# Patient Record
Sex: Female | Born: 1960 | Race: White | Hispanic: No | Marital: Married | Smoking: Never smoker
Health system: Southern US, Community
[De-identification: ages and names within clinical notes are randomized; demographics above are authoritative.]

## PROBLEM LIST (undated history)

## (undated) DIAGNOSIS — F32A Depression, unspecified: Secondary | ICD-10-CM

## (undated) DIAGNOSIS — F329 Major depressive disorder, single episode, unspecified: Secondary | ICD-10-CM

## (undated) DIAGNOSIS — F419 Anxiety disorder, unspecified: Secondary | ICD-10-CM

## (undated) HISTORY — DX: Major depressive disorder, single episode, unspecified: F32.9

## (undated) HISTORY — PX: DILATION AND CURETTAGE OF UTERUS: SHX78

## (undated) HISTORY — DX: Depression, unspecified: F32.A

## (undated) HISTORY — DX: Anxiety disorder, unspecified: F41.9

---

## 2006-04-03 ENCOUNTER — Ambulatory Visit: Payer: Self-pay | Admitting: Family Medicine

## 2011-05-23 ENCOUNTER — Ambulatory Visit: Payer: Self-pay | Admitting: Unknown Physician Specialty

## 2011-06-07 LAB — BASIC METABOLIC PANEL
BUN: 17 mg/dL (ref 4–21)
Creatinine: 0.9 mg/dL (ref 0.5–1.1)
Glucose: 80 mg/dL
Potassium: 4.2 mmol/L (ref 3.4–5.3)
Sodium: 143 mmol/L (ref 137–147)

## 2011-06-07 LAB — HEPATIC FUNCTION PANEL
ALT: 22 U/L (ref 7–35)
AST: 26 U/L (ref 13–35)
Alkaline Phosphatase: 58 U/L (ref 25–125)
BILIRUBIN, TOTAL: 0.4 mg/dL

## 2011-06-07 LAB — CBC AND DIFFERENTIAL
HCT: 42 % (ref 36–46)
Hemoglobin: 14.2 g/dL (ref 12.0–16.0)
Neutrophils Absolute: 6 /uL
Platelets: 238 10*3/uL (ref 150–399)
WBC: 8.3 10^3/mL

## 2011-06-07 LAB — TSH: TSH: 1.29 u[IU]/mL (ref 0.41–5.90)

## 2012-06-24 ENCOUNTER — Ambulatory Visit: Payer: Self-pay

## 2012-06-24 LAB — PREGNANCY, URINE: Pregnancy Test, Urine: NEGATIVE m[IU]/mL

## 2012-06-24 LAB — HEMATOCRIT: HCT: 42.9 % (ref 35.0–47.0)

## 2012-06-27 ENCOUNTER — Ambulatory Visit: Payer: Self-pay

## 2012-06-27 LAB — PREGNANCY, URINE: Pregnancy Test, Urine: NEGATIVE m[IU]/mL

## 2012-06-28 LAB — PATHOLOGY REPORT

## 2014-04-27 ENCOUNTER — Ambulatory Visit: Payer: Self-pay | Admitting: Unknown Physician Specialty

## 2014-04-27 LAB — HM COLONOSCOPY

## 2015-02-02 NOTE — Op Note (Signed)
PATIENT NAME:  Elizabeth Walter, MORT MR#:  [id number] DATE OF BIRTH:  [birth year]  DATE OF PROCEDURE:  06/27/2012  PREOPERATIVE DIAGNOSIS: Abnormal uterine bleeding.  POSTOPERATIVE DIAGNOSIS: Abnormal uterine bleeding  PROCEDURE PERFORMED: Dilation and curettage (failed hysteroscopy).   SURGEON: Wonda Cheng. Laurey Morale, M.D.   OPERATIVE FINDINGS: The cervix descends to introitus with traction. Cervix was tight and was unable to dilate adequately for hysteroscopy.   DESCRIPTION OF PROCEDURE: After adequate general anesthesia, the patient was prepped and draped in routine fashion. Examination under anesthesia revealed no abnormal masses. The cervix was grasped with a Jacob's tenaculum and sounded to 8 cm. The cervix was unable to be dilated far enough to allow introduction of the hysteroscope. The uterine cavity was systematically curetted with return of minimal amount of tissue. The patient tolerated the procedure well and left the Operating Room in good condition. Sponge and needle counts were said to be correct at the end of the procedure.  ____________________________ Wonda Cheng. Laurey Morale, MD pjr:slb D: 06/27/2012 13:08:39 ET T: 06/27/2012 13:55:32 ET JOB#: [id number]  cc: Wonda Cheng. Laurey Morale, MD, <Dictator> Rosina Lowenstein MD ELECTRONICALLY SIGNED 06/28/2012 15:51

## 2016-02-16 ENCOUNTER — Other Ambulatory Visit: Payer: Self-pay | Admitting: Family Medicine

## 2016-02-16 NOTE — Telephone Encounter (Signed)
Pt contacted office for refill request on the following medications: Sertraline HCI 50 MCG to [facility] on [address]. Pt scheduled a follow up visit for 05/01/16. Pt would like a new RX with one refill to last her until her appt in July.  Last written on: 03/04/15 Last OV: 03/04/15 Please advise. Thanks TNP

## 2016-02-16 NOTE — Telephone Encounter (Signed)
Please review-aa 

## 2016-02-17 DIAGNOSIS — N950 Postmenopausal bleeding: Secondary | ICD-10-CM | POA: Insufficient documentation

## 2016-02-17 DIAGNOSIS — F325 Major depressive disorder, single episode, in full remission: Secondary | ICD-10-CM | POA: Insufficient documentation

## 2016-02-17 DIAGNOSIS — F419 Anxiety disorder, unspecified: Secondary | ICD-10-CM | POA: Insufficient documentation

## 2016-02-17 DIAGNOSIS — F40228 Other natural environment type phobia: Secondary | ICD-10-CM | POA: Insufficient documentation

## 2016-02-17 DIAGNOSIS — IMO0002 Reserved for concepts with insufficient information to code with codable children: Secondary | ICD-10-CM | POA: Insufficient documentation

## 2016-02-17 DIAGNOSIS — M797 Fibromyalgia: Secondary | ICD-10-CM | POA: Insufficient documentation

## 2016-02-17 DIAGNOSIS — M9979 Connective tissue and disc stenosis of intervertebral foramina of abdomen and other regions: Secondary | ICD-10-CM | POA: Insufficient documentation

## 2016-02-17 MED ORDER — SERTRALINE HCL 50 MG PO TABS: 50.0000 mg | ORAL_TABLET | Freq: Every day | ORAL | Status: DC

## 2016-02-17 NOTE — Telephone Encounter (Signed)
rx sent in-aa 

## 2016-02-17 NOTE — Telephone Encounter (Signed)
3 month refills then see me thuis summer.

## 2016-03-01 ENCOUNTER — Other Ambulatory Visit: Payer: Self-pay | Admitting: Family Medicine

## 2016-04-25 ENCOUNTER — Other Ambulatory Visit: Payer: Self-pay | Admitting: Family Medicine

## 2016-04-25 NOTE — Telephone Encounter (Signed)
Dr g She is requesting refill on Xanax but has not been seen in over 1 year. Thanks [employer]

## 2016-05-01 ENCOUNTER — Ambulatory Visit (INDEPENDENT_AMBULATORY_CARE_PROVIDER_SITE_OTHER): Payer: BLUE CROSS/BLUE SHIELD | Admitting: Family Medicine

## 2016-05-01 ENCOUNTER — Encounter: Payer: Self-pay | Admitting: Family Medicine

## 2016-05-01 VITALS — BP 104/60 | HR 78 | Temp 98.0°F | Resp 16 | Ht 67.0 in | Wt 148.0 lb

## 2016-05-01 DIAGNOSIS — F419 Anxiety disorder, unspecified: Secondary | ICD-10-CM | POA: Diagnosis not present

## 2016-05-01 DIAGNOSIS — R5383 Other fatigue: Secondary | ICD-10-CM | POA: Diagnosis not present

## 2016-05-01 DIAGNOSIS — F329 Major depressive disorder, single episode, unspecified: Secondary | ICD-10-CM | POA: Diagnosis not present

## 2016-05-01 DIAGNOSIS — F32A Depression, unspecified: Secondary | ICD-10-CM

## 2016-05-01 DIAGNOSIS — N951 Menopausal and female climacteric states: Secondary | ICD-10-CM | POA: Diagnosis not present

## 2016-05-01 MED ORDER — ALPRAZOLAM 0.5 MG PO TABS: 0.5000 mg | ORAL_TABLET | Freq: Every day | ORAL | Status: DC | PRN

## 2016-05-01 MED ORDER — SERTRALINE HCL 100 MG PO TABS: 100.0000 mg | ORAL_TABLET | Freq: Every day | ORAL | Status: DC

## 2016-05-01 NOTE — Progress Notes (Addendum)
Patient ID: Elizabeth Walter, female   DOB: [birth year], 55 y.o.   MRN: [id number]    Subjective:  HPI Depression/Anxiety- Pt is here for a follow up of depression and anxiety. She needs a refill on her Sertraline and she had not been seen since 03/04/15. She reports that she is doing well on the medication and seems to be controlling her symptoms well. She would like a refill on her xanax as well. She only uses this when she is flying.   Fatigue- This has been occuring for about a year. She just feels like she is tired all the time. Pt reports that she is sleeping fairly well at night except when she has hot flashes. " I am just sleepy all the time." She also says that she gets lightheaded/dizzy when she stands up or bends over. Typically she is outside in the heat when this happens. Her BP today was 104/60.  Prior to Admission medications   Medication Sig Start Date End Date Taking? Authorizing Provider  ALPRAZolam Duanne Moron) 0.5 MG tablet TAKE 1 TABLET DAILY AS NEEDED 04/27/16  Yes Jerrol Banana., MD  sertraline (ZOLOFT) 50 MG tablet TAKE 1 TABLET(S) BY MOUTH DAILY 03/01/16  Yes Jerrol Banana., MD    Patient Active Problem List   Diagnosis Date Noted  . Anxiety 02/17/2016  . 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 Fibrositis 02/17/2016  . 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 Herniation of nucleus pulposus 02/17/2016  . Postmenopausal bleeding 02/17/2016    History reviewed. No pertinent past medical history.  Social History   Social History  . Marital Status: Married    Spouse Name: N/A  . Number of Children: N/A  . Years of Education: N/A   Occupational History  . Not on file.   Social History Main Topics  . Smoking status: Never Smoker   . Smokeless tobacco: Not on file  . Alcohol Use: Yes     Comment: glass of wine 6 days a week  . Drug Use: No  . Sexual Activity: Not on file   Other Topics Concern  . Not on file   Social  History Narrative    Allergies  Allergen Reactions  . Penicillins Rash    Review of Systems  Constitutional: Positive for malaise/fatigue.  HENT: Negative.   Eyes: Negative.   Respiratory: Negative.   Cardiovascular: Negative.   Gastrointestinal: Negative.   Genitourinary: Negative.   Musculoskeletal: Negative.   Skin: Negative.   Neurological: Positive for dizziness.  Endo/Heme/Allergies: Negative.   Psychiatric/Behavioral: Negative.     Immunization History  Administered Date(s) Administered  . Tdap 07/21/2009   Objective:  BP 104/60 mmHg  Pulse 78  Temp(Src) 98 F (36.7 C) (Oral)  Resp 16  Ht 5\' 7"  (1.702 m)  Wt 148 lb (67.132 kg)  BMI 23.17 kg/m2  Physical Exam  Constitutional: She is oriented to person, place, and time and well-developed, well-nourished, and in no distress.  HENT:  Head: Normocephalic and atraumatic.  Right Ear: External ear normal.  Left Ear: External ear normal.  Nose: Nose normal.  Eyes: Conjunctivae and EOM are normal. Pupils are equal, round, and reactive to light.  Neck: Normal range of motion. Neck supple.  Cardiovascular: Normal rate, regular rhythm, normal heart sounds and intact distal pulses.   Pulmonary/Chest: Effort normal and breath sounds normal.  Abdominal: Soft. Bowel sounds are normal.  Musculoskeletal: Normal range of motion.  Neurological: She is alert and  oriented to person, place, and time. She has normal reflexes. Gait normal. GCS score is 15.  Skin: Skin is warm and dry.  Psychiatric: Mood, memory, affect and judgment normal.    Lab Results  Component Value Date   WBC 8.3 06/07/2011   HGB 14.2 06/07/2011   HCT 42.9 06/24/2012   PLT 238 06/07/2011   TSH 1.29 06/07/2011    CMP     Component Value Date/Time   NA 143 06/07/2011   K 4.2 06/07/2011   BUN 17 06/07/2011   CREATININE 0.9 06/07/2011   AST 26 06/07/2011   ALT 22 06/07/2011   ALKPHOS 58 06/07/2011    Assessment and Plan :  1.  Anxiety Increase Sertraline due to irritability possibly related to menopausal. If does help pt will consider HRT options.   - ALPRAZolam (XANAX) 0.5 MG tablet; Take 1 tablet (0.5 mg total) by mouth daily as needed.  Dispense: 10 tablet; Refill: 0 - sertraline (ZOLOFT) 100 MG tablet; Take 1 tablet (100 mg total) by mouth daily.  Dispense: 30 tablet; Refill: 12  2. Clinical depression  - sertraline (ZOLOFT) 100 MG tablet; Take 1 tablet (100 mg total) by mouth daily.  Dispense: 30 tablet; Refill: 12  3. Menopausal syndrome (hot flashes) Consider HRT if the increase of sertraline does not help symptoms. Patient has had hot flashes for several years. She is postmenopausal. This can possible benefits of HRT discussed at some length.  He has had significant hot flashes for the past 4 years. I think the menopause is also contributing to her depression and anxiety and irritability. 4. Other fatigue Overall symptoms discussed and their relationship to each other. - CBC with Differential/Platelet - TSH - Comprehensive metabolic panel  Patient was seen and examined by Dr. Miguel Aschoff, and noted scribed by Webb Laws, [place] MD [facility] 05/01/2016 10:51 AM

## 2016-05-02 ENCOUNTER — Telehealth: Payer: Self-pay

## 2016-05-02 LAB — COMPREHENSIVE METABOLIC PANEL
ALBUMIN: 4.3 g/dL (ref 3.5–5.5)
ALT: 19 IU/L (ref 0–32)
AST: 23 IU/L (ref 0–40)
Albumin/Globulin Ratio: 1.7 (ref 1.2–2.2)
Alkaline Phosphatase: 93 IU/L (ref 39–117)
BUN / CREAT RATIO: 20 (ref 9–23)
BUN: 18 mg/dL (ref 6–24)
Bilirubin Total: 0.5 mg/dL (ref 0.0–1.2)
CALCIUM: 9.6 mg/dL (ref 8.7–10.2)
CO2: 25 mmol/L (ref 18–29)
CREATININE: 0.91 mg/dL (ref 0.57–1.00)
Chloride: 102 mmol/L (ref 96–106)
GFR calc Af Amer: 83 mL/min/{1.73_m2} (ref >59)
GFR, EST NON AFRICAN AMERICAN: 72 mL/min/{1.73_m2} (ref >59)
GLOBULIN, TOTAL: 2.6 g/dL (ref 1.5–4.5)
GLUCOSE: 98 mg/dL (ref 65–99)
Potassium: 4.4 mmol/L (ref 3.5–5.2)
SODIUM: 143 mmol/L (ref 134–144)
Total Protein: 6.9 g/dL (ref 6.0–8.5)

## 2016-05-02 LAB — CBC WITH DIFFERENTIAL/PLATELET
BASOS: 0 %
Basophils Absolute: 0 10*3/uL (ref 0.0–0.2)
EOS (ABSOLUTE): 0 10*3/uL (ref 0.0–0.4)
EOS: 1 %
HEMATOCRIT: 42 % (ref 34.0–46.6)
HEMOGLOBIN: 14 g/dL (ref 11.1–15.9)
IMMATURE GRANS (ABS): 0 10*3/uL (ref 0.0–0.1)
IMMATURE GRANULOCYTES: 0 %
LYMPHS: 33 %
Lymphocytes Absolute: 1.6 10*3/uL (ref 0.7–3.1)
MCH: 30.2 pg (ref 26.6–33.0)
MCHC: 33.3 g/dL (ref 31.5–35.7)
MCV: 91 fL (ref 79–97)
MONOCYTES: 8 %
Monocytes Absolute: 0.4 10*3/uL (ref 0.1–0.9)
NEUTROS PCT: 58 %
Neutrophils Absolute: 2.7 10*3/uL (ref 1.4–7.0)
Platelets: 242 10*3/uL (ref 150–379)
RBC: 4.63 x10E6/uL (ref 3.77–5.28)
RDW: 13.6 % (ref 12.3–15.4)
WBC: 4.7 10*3/uL (ref 3.4–10.8)

## 2016-05-02 LAB — TSH: TSH: 1.2 u[IU]/mL (ref 0.450–4.500)

## 2016-05-02 NOTE — Telephone Encounter (Signed)
-----   Message from Jerrol Banana., MD sent at 05/02/2016  8:15 AM EDT ----- Labs okay.

## 2016-05-02 NOTE — Telephone Encounter (Signed)
Pt advised.   Thanks,   -Tula Schryver  

## 2016-06-29 ENCOUNTER — Ambulatory Visit: Payer: BLUE CROSS/BLUE SHIELD | Admitting: Family Medicine

## 2016-07-06 ENCOUNTER — Ambulatory Visit: Payer: BLUE CROSS/BLUE SHIELD | Admitting: Family Medicine

## 2017-06-29 ENCOUNTER — Other Ambulatory Visit: Payer: Self-pay | Admitting: Family Medicine

## 2017-06-29 DIAGNOSIS — F32A Depression, unspecified: Secondary | ICD-10-CM

## 2017-06-29 DIAGNOSIS — F419 Anxiety disorder, unspecified: Secondary | ICD-10-CM

## 2017-06-29 DIAGNOSIS — F329 Major depressive disorder, single episode, unspecified: Secondary | ICD-10-CM

## 2017-07-12 ENCOUNTER — Telehealth: Payer: Self-pay | Admitting: Family Medicine

## 2017-07-12 NOTE — Telephone Encounter (Signed)
Patient call and left message that she needs a referral to [facility] to get her mammogram and would also like to know when her last DPT shot was. CB# [phone]  Thanks CC

## 2017-07-13 NOTE — Telephone Encounter (Signed)
Patient advised that we have not seen her since 04/2016 and last mammogram we have on file was 2012. Wanted to check with you if that is ok to place an order for her routine mammogram or does patient need to be seen first?  Also advised patient she had Tdap in 2010 and she just needs Td booster in 2020. Her daughter in law told patient Tdap had to be within last 5 years. I advised patient that we have not heard of this regulation, our current regulation for protection against whopping cough for the babies is as long as you had Tdap one time in your life, unless you are pregnant, you do not need to repeat it. Patient Elizabeth Walter, RMA

## 2017-07-17 NOTE — Telephone Encounter (Signed)
If Tdap is well documented with [place] she is fine. I checked with the knobs an OB  to make sure.

## 2017-08-29 LAB — HM MAMMOGRAPHY

## 2017-11-19 ENCOUNTER — Telehealth: Payer: Self-pay

## 2017-11-19 NOTE — Telephone Encounter (Signed)
Please review. Ok to send something into the pharmacy?

## 2017-11-19 NOTE — Telephone Encounter (Signed)
Patient is requesting a RX be sent to [place]. Symptoms include stuffy nose, congestion, and headache X 3 weeks. Patient advised she may need a OV for evaluation, but she requested message be sent anyway. CB#[phone]

## 2017-11-19 NOTE — Telephone Encounter (Signed)
Scheduled appt for tomorrow °

## 2017-11-19 NOTE — Telephone Encounter (Signed)
I could see her tomorrow or Wed.

## 2017-11-20 ENCOUNTER — Encounter: Payer: Self-pay | Admitting: Family Medicine

## 2017-11-20 ENCOUNTER — Ambulatory Visit (INDEPENDENT_AMBULATORY_CARE_PROVIDER_SITE_OTHER): Payer: BLUE CROSS/BLUE SHIELD | Admitting: Family Medicine

## 2017-11-20 VITALS — BP 120/76 | HR 84 | Temp 98.1°F | Resp 16 | Wt 153.0 lb

## 2017-11-20 DIAGNOSIS — J40 Bronchitis, not specified as acute or chronic: Secondary | ICD-10-CM

## 2017-11-20 DIAGNOSIS — J0140 Acute pansinusitis, unspecified: Secondary | ICD-10-CM | POA: Diagnosis not present

## 2017-11-20 MED ORDER — DOXYCYCLINE HYCLATE 100 MG PO TABS: 100.0000 mg | ORAL_TABLET | Freq: Two times a day (BID) | ORAL | 0 refills | Status: DC

## 2017-11-20 NOTE — Progress Notes (Signed)
Patient: Elizabeth Walter Female    DOB: 14-Apr-1961   57 y.o.   MRN: [id number] Visit Date: 11/20/2017  Today's Provider: Wilhemena Durie, MD   Chief Complaint  Patient presents with  . URI   Subjective:    URI   This is a new problem. Episode onset: current sx x 3 weeks; was diagnosed with bronchitis in December, and was treated with Z Pak. Pt states sx improved after abx use, but never resolved. Started feeling worse 3 weeks ago. The problem has been unchanged. Maximum temperature: no documented temp, but pt felt feverish. Associated symptoms include congestion, coughing (sometimes dry, sometimes productive), headaches, rhinorrhea, sneezing, a sore throat and swollen glands. Pertinent negatives include no abdominal pain, chest pain, ear pain, nausea, neck pain, plugged ear sensation, sinus pain, vomiting or wheezing. Treatments tried: OTC cold medications. The treatment provided no relief.       Allergies  Allergen Reactions  . Penicillins Rash     Current Outpatient Medications:  .  ALPRAZolam (XANAX) 0.5 MG tablet, Take 1 tablet (0.5 mg total) by mouth daily as needed., Disp: 10 tablet, Rfl: 0 .  sertraline (ZOLOFT) 100 MG tablet, TAKE 1 TABLET (100 MG TOTAL) BY MOUTH DAILY., Disp: 90 tablet, Rfl: 1  Review of Systems  HENT: Positive for congestion, rhinorrhea, sneezing and sore throat. Negative for ear pain and sinus pain.   Respiratory: Positive for cough (sometimes dry, sometimes productive). Negative for wheezing.   Cardiovascular: Negative for chest pain.  Gastrointestinal: Negative for abdominal pain, nausea and vomiting.  Musculoskeletal: Negative for neck pain.  Neurological: Positive for headaches.    Social History   Tobacco Use  . Smoking status: Never Smoker  . Smokeless tobacco: Never Used  Substance Use Topics  . Alcohol use: Yes    Comment: glass of wine 6 days a week   Objective:   BP 120/76 (BP Location: Left Arm, Patient Position: Sitting,  Cuff Size: Normal)   Pulse 84   Temp 98.1 F (36.7 C) (Oral)   Resp 16   Wt 153 lb (69.4 kg)   SpO2 98%   BMI 23.96 kg/m  Vitals:   11/20/17 0808  BP: 120/76  Pulse: 84  Resp: 16  Temp: 98.1 F (36.7 C)  TempSrc: Oral  SpO2: 98%  Weight: 153 lb (69.4 kg)     Physical Exam  Constitutional: She appears well-developed and well-nourished.  HENT:  Head: Normocephalic.  Right Ear: Tympanic membrane normal.  Left Ear: Tympanic membrane normal.  Nose: Nose normal. Right sinus exhibits no maxillary sinus tenderness and no frontal sinus tenderness. Left sinus exhibits no maxillary sinus tenderness and no frontal sinus tenderness.  Mouth/Throat: No oropharyngeal exudate.  Eyes: Conjunctivae are normal.  Neck: Normal range of motion. Neck supple.  Cardiovascular: Normal rate, regular rhythm and normal heart sounds.  Pulmonary/Chest: Effort normal and breath sounds normal. No respiratory distress.  Lymphadenopathy:    She has no cervical adenopathy.  Psychiatric: She has a normal mood and affect. Her behavior is normal.        Assessment & Plan:     1. Bronchitis Will treat with Doxy as below. Return if sx fail to improve or worsen. - doxycycline (VIBRA-TABS) 100 MG tablet; Take 1 tablet (100 mg total) by mouth 2 (two) times daily.  Dispense: 20 tablet; Refill: 0  2. Acute non-recurrent pansinusitis Advised pt to use Neill Sinus Rinse for sinusitis. Treat with abx as below. -  doxycycline (VIBRA-TABS) 100 MG tablet; Take 1 tablet (100 mg total) by mouth 2 (two) times daily.  Dispense: 20 tablet; Refill: 0    Patient seen and examined by Miguel Aschoff, MD, and note scribed by Martha Clan, CMA.  Richard Cranford Mon, MD  [facility]

## 2017-11-20 NOTE — Patient Instructions (Signed)
Dr. Rosanna Randy recommends using Maryruth Hancock Sinus Rinse.  Sinus Rinse What is a sinus rinse? A sinus rinse is a simple home treatment that is used to rinse your sinuses with a sterile mixture of salt and water (saline solution). Sinuses are air-filled spaces in your skull behind the bones of your face and forehead that open into your nasal cavity. You will use the following:  Saline solution.  Neti pot or spray bottle. This releases the saline solution into your nose and through your sinuses. Neti pots and spray bottles can be purchased at [employer], a health food store, or online.  When would I do a sinus rinse? A sinus rinse can help to clear mucus, dirt, dust, or pollen from the nasal cavity. You may do a sinus rinse when you have a cold, a virus, nasal allergy symptoms, a sinus infection, or stuffiness in the nose or sinuses. If you are considering a sinus rinse:  Ask your child's health care provider before performing a sinus rinse on your child.  Do not do a sinus rinse if you have had ear or nasal surgery, ear infection, or blocked ears.  How do I do a sinus rinse?  Wash your hands.  Disinfect your device according to the directions provided and then dry it.  Use the solution that comes with your device or one that is sold separately in stores. Follow the mixing directions on the package.  Fill your device with the amount of saline solution as directed by the device instructions.  Stand over a sink and tilt your head sideways over the sink.  Place the spout of the device in your upper nostril (the one closer to the ceiling).  Gently pour or squeeze the saline solution into the nasal cavity. The liquid should drain to the lower nostril if you are not overly congested.  Gently blow your nose. Blowing too hard may cause ear pain.  Repeat in the other nostril.  Clean and rinse your device with clean water and then air-dry it. Are there risks of a sinus rinse? Sinus rinse  is generally very safe and effective. However, there are a few risks, which include:  A burning sensation in the sinuses. This may happen if you do not make the saline solution as directed. Make sure to follow all directions when making the saline solution.  Infection from contaminated water. This is rare, but possible.  Nasal irritation.  This information is not intended to replace advice given to you by your health care provider. Make sure you discuss any questions you have with your health care provider. Document Released: 04/29/2014 Document Revised: 08/29/2016 Document Reviewed: 02/17/2014 Elsevier Interactive Patient Education  2017 [employer].

## 2017-11-23 ENCOUNTER — Telehealth: Payer: Self-pay | Admitting: Family Medicine

## 2017-11-23 NOTE — Telephone Encounter (Signed)
Please advise 

## 2017-11-23 NOTE — Telephone Encounter (Signed)
Pt stated she saw Dr. Rosanna Randy on 11/20/17 and that he told her if she wasn't feeling better to call back and he would send in an Rx for Prednisone. Pt is requesting Prednisone sent to [facility]. Pt was advised Dr. Rosanna Randy is out of the office today and requested the message be sent to another provider. Please advise. Thanks TNP

## 2017-11-24 MED ORDER — PREDNISONE 10 MG PO TABS: ORAL_TABLET | ORAL | 0 refills | Status: AC

## 2017-11-24 NOTE — Telephone Encounter (Signed)
Have sent prescription for prednisone to [facility]

## 2017-11-26 NOTE — Telephone Encounter (Signed)
Patient advised.

## 2018-01-25 ENCOUNTER — Ambulatory Visit: Payer: BLUE CROSS/BLUE SHIELD | Admitting: Physician Assistant

## 2018-01-25 ENCOUNTER — Other Ambulatory Visit: Payer: Self-pay | Admitting: Family Medicine

## 2018-01-25 DIAGNOSIS — F419 Anxiety disorder, unspecified: Secondary | ICD-10-CM

## 2018-01-25 DIAGNOSIS — F32A Depression, unspecified: Secondary | ICD-10-CM

## 2018-01-25 DIAGNOSIS — F329 Major depressive disorder, single episode, unspecified: Secondary | ICD-10-CM

## 2018-03-05 ENCOUNTER — Ambulatory Visit: Admit: 2018-03-05 | Payer: Self-pay | Admitting: Ophthalmology

## 2018-03-05 SURGERY — BLEPHAROPLASTY
Anesthesia: Monitor Anesthesia Care | Laterality: Bilateral

## 2018-06-24 ENCOUNTER — Encounter: Payer: Self-pay | Admitting: Family Medicine

## 2018-06-24 ENCOUNTER — Ambulatory Visit (INDEPENDENT_AMBULATORY_CARE_PROVIDER_SITE_OTHER): Payer: BLUE CROSS/BLUE SHIELD | Admitting: Family Medicine

## 2018-06-24 VITALS — BP 108/62 | HR 78 | Temp 98.3°F | Resp 16 | Ht 67.0 in | Wt 144.0 lb

## 2018-06-24 DIAGNOSIS — F329 Major depressive disorder, single episode, unspecified: Secondary | ICD-10-CM | POA: Diagnosis not present

## 2018-06-24 DIAGNOSIS — Z124 Encounter for screening for malignant neoplasm of cervix: Secondary | ICD-10-CM

## 2018-06-24 DIAGNOSIS — R232 Flushing: Secondary | ICD-10-CM | POA: Diagnosis not present

## 2018-06-24 DIAGNOSIS — F32A Depression, unspecified: Secondary | ICD-10-CM

## 2018-06-24 DIAGNOSIS — Z23 Encounter for immunization: Secondary | ICD-10-CM | POA: Diagnosis not present

## 2018-06-24 DIAGNOSIS — F419 Anxiety disorder, unspecified: Secondary | ICD-10-CM

## 2018-06-24 DIAGNOSIS — F325 Major depressive disorder, single episode, in full remission: Secondary | ICD-10-CM

## 2018-06-24 MED ORDER — ALPRAZOLAM 0.5 MG PO TABS: 0.5000 mg | ORAL_TABLET | Freq: Every day | ORAL | 0 refills | Status: AC | PRN

## 2018-06-24 MED ORDER — SERTRALINE HCL 100 MG PO TABS: 100.0000 mg | ORAL_TABLET | Freq: Every day | ORAL | 3 refills | Status: DC

## 2018-06-24 NOTE — Patient Instructions (Signed)
Start Black Cohash once daily for hot flashes.

## 2018-06-24 NOTE — Progress Notes (Signed)
Patient: Elizabeth Walter Female    DOB: 05-Mar-1961   57 y.o.   MRN: [id number] Visit Date: 06/24/2018  Today's Provider: Wilhemena Durie, MD   Chief Complaint  Patient presents with  . Anxiety   Subjective:    HPI Patient comes in today for a follow up on chronic issues. She was last seen in the office for this in 04/2016.  Depression/Anxiety- Patient is currently on setraline 100mg  daily. She reports good compliance, and good symptom control. Depression screen PHQ 2/9 06/24/2018  Decreased Interest 1  Down, Depressed, Hopeless 0  PHQ - 2 Score 1  Altered sleeping 1  Tired, decreased energy 1  Change in appetite 0  Feeling bad or failure about yourself  0  Trouble concentrating 0  Moving slowly or fidgety/restless 0  Suicidal thoughts 0  PHQ-9 Score 3  Difficult doing work/chores Not difficult at all     Hot Flashes- Patient reports that she has been dealing with this for at least 8 years. However, her symptoms are becoming more frequent and lasting all day. Patient wants to discuss possibly starting medication for this.      Allergies  Allergen Reactions  . Penicillins Rash     Current Outpatient Medications:  .  ALPRAZolam (XANAX) 0.5 MG tablet, Take 1 tablet (0.5 mg total) by mouth daily as needed., Disp: 10 tablet, Rfl: 0 .  sertraline (ZOLOFT) 100 MG tablet, TAKE 1 TABLET (100 MG TOTAL) BY MOUTH DAILY, Disp: 90 tablet, Rfl: 0 .  doxycycline (VIBRA-TABS) 100 MG tablet, Take 1 tablet (100 mg total) by mouth 2 (two) times daily. (Patient not taking: Reported on 06/24/2018), Disp: 20 tablet, Rfl: 0  Review of Systems  Constitutional: Negative for activity change, appetite change, chills, diaphoresis, fatigue, fever and unexpected weight change.  Eyes: Negative.   Respiratory: Negative for cough and shortness of breath.   Cardiovascular: Negative for chest pain, palpitations and leg swelling.  Gastrointestinal: Negative.   Endocrine: Negative for cold  intolerance, heat intolerance, polydipsia, polyphagia and polyuria.  Allergic/Immunologic: Negative for environmental allergies.  Neurological: Negative for dizziness and light-headedness.  Hematological: Negative.   Psychiatric/Behavioral: Negative.     Social History   Tobacco Use  . Smoking status: Never Smoker  . Smokeless tobacco: Never Used  Substance Use Topics  . Alcohol use: Yes    Comment: glass of wine 6 days a week   Objective:   BP 108/62 (BP Location: Left Arm, Patient Position: Sitting, Cuff Size: Normal)   Pulse 78   Temp 98.3 F (36.8 C)   Resp 16   Ht 5\' 7"  (1.702 m)   Wt 144 lb (65.3 kg)   SpO2 99%   BMI 22.55 kg/m  Vitals:   06/24/18 1506  BP: 108/62  Pulse: 78  Resp: 16  Temp: 98.3 F (36.8 C)  SpO2: 99%  Weight: 144 lb (65.3 kg)  Height: 5\' 7"  (1.702 m)     Physical Exam  Constitutional: She is oriented to person, place, and time. She appears well-developed and well-nourished.  HENT:  Head: Normocephalic and atraumatic.  Eyes: Conjunctivae are normal. No scleral icterus.  Neck: No thyromegaly present.  Cardiovascular: Normal rate, regular rhythm and normal heart sounds.  Pulmonary/Chest: Effort normal.  Abdominal: Soft.  Musculoskeletal: She exhibits no edema.  Neurological: She is alert and oriented to person, place, and time.  Skin: Skin is warm and dry.  Psychiatric: She has a normal mood and affect. Her  behavior is normal. Judgment and thought content normal.        Assessment & Plan:     1. Anxiety  - sertraline (ZOLOFT) 100 MG tablet; Take 1 tablet (100 mg total) by mouth daily.  Dispense: 90 tablet; Refill: 3 - ALPRAZolam (XANAX) 0.5 MG tablet; Take 1 tablet (0.5 mg total) by mouth daily as needed.  Dispense: 10 tablet; Refill: 0  2. Clinical depression In remission. Discussed probable lifelong SSRI. - sertraline (ZOLOFT) 100 MG tablet; Take 1 tablet (100 mg total) by mouth daily.  Dispense: 90 tablet; Refill: 3  3. Hot  flashes Start with Bloack cohash OTC. Pt considering CBD. We discussed this at some length. - Ambulatory referral to Gynecology  4. Cervical cancer screening  - Ambulatory referral to Gynecology  5. Flu vaccine need Also recommend medcial exam 2020. - Flu Vaccine QUAD 6+ mos PF IM (Fluarix Quad PF)        Wilhemena Durie, MD  [facility]

## 2018-07-18 ENCOUNTER — Encounter: Payer: Self-pay | Admitting: Obstetrics and Gynecology

## 2018-07-18 ENCOUNTER — Other Ambulatory Visit (HOSPITAL_COMMUNITY)
Admission: RE | Admit: 2018-07-18 | Discharge: 2018-07-18 | Disposition: A | Discharge status: Home / Self Care | Payer: BLUE CROSS/BLUE SHIELD | Source: Ambulatory Visit | Attending: Obstetrics and Gynecology | Admitting: Obstetrics and Gynecology

## 2018-07-18 ENCOUNTER — Ambulatory Visit (INDEPENDENT_AMBULATORY_CARE_PROVIDER_SITE_OTHER): Payer: BLUE CROSS/BLUE SHIELD | Admitting: Obstetrics and Gynecology

## 2018-07-18 VITALS — BP 112/66 | HR 91 | Ht 67.0 in | Wt 146.0 lb

## 2018-07-18 DIAGNOSIS — Z01411 Encounter for gynecological examination (general) (routine) with abnormal findings: Secondary | ICD-10-CM

## 2018-07-18 DIAGNOSIS — Z1151 Encounter for screening for human papillomavirus (HPV): Secondary | ICD-10-CM

## 2018-07-18 DIAGNOSIS — Z1239 Encounter for other screening for malignant neoplasm of breast: Secondary | ICD-10-CM

## 2018-07-18 DIAGNOSIS — N951 Menopausal and female climacteric states: Secondary | ICD-10-CM

## 2018-07-18 DIAGNOSIS — Z01419 Encounter for gynecological examination (general) (routine) without abnormal findings: Secondary | ICD-10-CM

## 2018-07-18 DIAGNOSIS — Z124 Encounter for screening for malignant neoplasm of cervix: Secondary | ICD-10-CM | POA: Diagnosis present

## 2018-07-18 NOTE — Patient Instructions (Signed)
I value your feedback and entrusting us with your care. If you get a  patient survey, I would appreciate you taking the time to let us know about your experience today. Thank you!  [facility]: [phone]    

## 2018-07-18 NOTE — Progress Notes (Signed)
PCP: Jerrol Banana., MD   Chief Complaint  Patient presents with  . Gynecologic Exam    menopausal S&S    HPI:      [name] is a 57 y.o. No obstetric history on file. who LMP was No LMP recorded. Patient is postmenopausal., presents today for her NP>3 yrs annual examination.  Her menses are absent due to menopause. She does not have intermenstrual bleeding. She does have vasomotor sx. Has tried black cohosh recently but didn't give it much time to work. Sx are not as intense as late 68s but daily and frequent. Interested in some relief.  Sex activity: single partner, contraception - post menopausal status. She does have vaginal dryness. Is using lubricants with relief.   Last Pap: June 07, 2012  Results were: no abnormalities /neg HPV DNA.  Hx of STDs: none  Last mammogram: August 29, 2017  Results were: normal--routine follow-up in 12 months There is no FH of breast cancer. There is no FH of ovarian cancer. The patient does do self-breast exams.  Colonoscopy: colonoscopy 5 years ago without abnormalities.  Repeat due after 10 years.   Tobacco use: The patient denies current or previous tobacco use. Alcohol use: glass of wine with dinner Exercise: moderately active  She does get adequate calcium and Vitamin D in her diet.  Labs with PCP.   Past Medical History:  Diagnosis Date  . Anxiety and depression     Past Surgical History:  Procedure Laterality Date  . DILATION AND CURETTAGE OF UTERUS      Family History  Problem Relation Age of Onset  . COPD Mother   . Depression Son   . Arthritis Sister   . Arthritis Son     Social History   Socioeconomic History  . Marital status: Married    Spouse name: Not on file  . Number of children: Not on file  . Years of education: Not on file  . Highest education level: Not on file  Occupational History  . Not on file  Social Needs  . Financial resource strain: Not on file  . Food insecurity:   Worry: Not on file    Inability: Not on file  . Transportation needs:    Medical: Not on file    Non-medical: Not on file  Tobacco Use  . Smoking status: Never Smoker  . Smokeless tobacco: Never Used  Substance and Sexual Activity  . Alcohol use: Yes    Comment: glass of wine 6 days a week  . Drug use: No  . Sexual activity: Yes    Birth control/protection: None  Lifestyle  . Physical activity:    Days per week: Not on file    Minutes per session: Not on file  . Stress: Not on file  Relationships  . Social connections:    Talks on phone: Not on file    Gets together: Not on file    Attends religious service: Not on file    Active member of club or organization: Not on file    Attends meetings of clubs or organizations: Not on file    Relationship status: Not on file  . Intimate partner violence:    Fear of current or ex partner: Not on file    Emotionally abused: Not on file    Physically abused: Not on file    Forced sexual activity: Not on file  Other Topics Concern  . Not on file  Social History  Narrative  . Not on file    Outpatient Medications Prior to Visit  Medication Sig Dispense Refill  . ALPRAZolam (XANAX) 0.5 MG tablet Take 1 tablet (0.5 mg total) by mouth daily as needed. 10 tablet 0  . sertraline (ZOLOFT) 100 MG tablet Take 1 tablet (100 mg total) by mouth daily. 90 tablet 3  . doxycycline (VIBRA-TABS) 100 MG tablet Take 1 tablet (100 mg total) by mouth 2 (two) times daily. (Patient not taking: Reported on 06/24/2018) 20 tablet 0   No facility-administered medications prior to visit.      ROS:  Review of Systems  Constitutional: Negative for fatigue, fever and unexpected weight change.  Respiratory: Negative for cough, shortness of breath and wheezing.   Cardiovascular: Negative for chest pain, palpitations and leg swelling.  Gastrointestinal: Negative for blood in stool, constipation, diarrhea, nausea and vomiting.  Endocrine: Negative for cold  intolerance, heat intolerance and polyuria.  Genitourinary: Negative for dyspareunia, dysuria, flank pain, frequency, genital sores, hematuria, menstrual problem, pelvic pain, urgency, vaginal bleeding, vaginal discharge and vaginal pain.  Musculoskeletal: Negative for back pain, joint swelling and myalgias.  Skin: Negative for rash.  Neurological: Negative for dizziness, syncope, light-headedness, numbness and headaches.  Hematological: Negative for adenopathy.  Psychiatric/Behavioral: Negative for agitation, confusion, sleep disturbance and suicidal ideas. The patient is not nervous/anxious.   BREAST: No symptoms    Objective: BP 112/66 (BP Location: Left Arm, Patient Position: Sitting, Cuff Size: Normal)   Pulse 91   Ht 5\' 7"  (1.702 m)   Wt 146 lb (66.2 kg)   BMI 22.87 kg/m    Physical Exam  Constitutional: She is oriented to person, place, and time. She appears well-developed and well-nourished.  Genitourinary: Vagina normal and uterus normal. There is no rash or tenderness on the right labia. There is no rash or tenderness on the left labia. No erythema or tenderness in the vagina. No vaginal discharge found. Right adnexum does not display mass and does not display tenderness. Left adnexum does not display mass and does not display tenderness. Cervix does not exhibit motion tenderness or polyp. Uterus is not enlarged or tender.  Neck: Normal range of motion. No thyromegaly present.  Cardiovascular: Normal rate, regular rhythm and normal heart sounds.  No murmur heard. Pulmonary/Chest: Effort normal and breath sounds normal. Right breast exhibits no mass, no nipple discharge, no skin change and no tenderness. Left breast exhibits no mass, no nipple discharge, no skin change and no tenderness.  Abdominal: Soft. There is no tenderness. There is no guarding.  Musculoskeletal: Normal range of motion.  Neurological: She is alert and oriented to person, place, and time. No cranial nerve  deficit.  Psychiatric: She has a normal mood and affect. Her behavior is normal.  Vitals reviewed.   Assessment/Plan:  Encounter for annual routine gynecological examination  Cervical cancer screening - Plan: Cytology - PAP  Screening for HPV (human papillomavirus) - Plan: Cytology - PAP  Screening for breast cancer - Pt to sched mammo - Plan: MM 3D SCREEN BREAST BILATERAL  Menopausal symptom - Discussed estroven vs HRT. Some benefit with effexor/paxil but pt already on zoloft. Pt wants to give black cohosh change to work. Can start HRT prn. F/u prn          GYN counsel breast self exam, mammography screening, use and side effects of HRT, menopause, adequate intake of calcium and vitamin D, diet and exercise    F/U  Return in about 1 year (around 07/19/2019).  Kaisey Huseby B. Taunia Frasco, PA-C 07/18/2018 9:05 AM

## 2018-07-19 LAB — CYTOLOGY - PAP
Diagnosis: NEGATIVE
HPV: NOT DETECTED

## 2018-09-02 ENCOUNTER — Encounter (INDEPENDENT_AMBULATORY_CARE_PROVIDER_SITE_OTHER): Payer: Self-pay

## 2018-09-02 ENCOUNTER — Ambulatory Visit
Admission: RE | Admit: 2018-09-02 | Discharge: 2018-09-02 | Disposition: A | Discharge status: Home / Self Care | Payer: No Typology Code available for payment source | Source: Ambulatory Visit | Attending: Obstetrics and Gynecology | Admitting: Obstetrics and Gynecology

## 2018-09-02 DIAGNOSIS — Z1239 Encounter for other screening for malignant neoplasm of breast: Secondary | ICD-10-CM | POA: Insufficient documentation

## 2018-09-09 ENCOUNTER — Encounter: Payer: Self-pay | Admitting: Obstetrics and Gynecology

## 2018-11-11 ENCOUNTER — Ambulatory Visit: Payer: No Typology Code available for payment source | Admitting: Physician Assistant

## 2018-11-11 ENCOUNTER — Encounter: Payer: Self-pay | Admitting: Physician Assistant

## 2018-11-11 VITALS — BP 122/81 | HR 68 | Temp 97.8°F | Resp 16 | Wt 150.0 lb

## 2018-11-11 DIAGNOSIS — J0140 Acute pansinusitis, unspecified: Secondary | ICD-10-CM

## 2018-11-11 MED ORDER — FLUTICASONE FUROATE 27.5 MCG/SPRAY NA SUSP: 2.0000 | Freq: Every day | NASAL | 12 refills | Status: DC

## 2018-11-11 MED ORDER — DOXYCYCLINE HYCLATE 100 MG PO TABS: 100.0000 mg | ORAL_TABLET | Freq: Two times a day (BID) | ORAL | 0 refills | Status: DC

## 2018-11-11 NOTE — Progress Notes (Signed)
Patient: Elizabeth Walter Female    DOB: 04/17/1961   58 y.o.   MRN: [id number] Visit Date: 11/11/2018  Today's Provider: Mar Daring, PA-C   Chief Complaint  Patient presents with  . Cough   Subjective:     Cough  This is a new problem. The current episode started more than 1 month ago. The cough is non-productive. Associated symptoms include ear congestion, a fever, headaches, myalgias, nasal congestion and shortness of breath. Pertinent negatives include no chest pain, chills, ear pain, heartburn, hemoptysis, postnasal drip, rash, rhinorrhea, sore throat, sweats, weight loss or wheezing. Treatments tried: mucinex, dayquil, nightquil, Tylenol, Aleve. The treatment provided mild relief.   Patient has had had sinus congestion and cough for 1 month. Patient states cough is slightly productive. Patient also has symptoms of ear congestion, headaches, mucle aches, nasal congestion, and slight shortness of breath. Patient has been taking Mucinex, Tylenol, dayquil and Nightquil with mild relief.    Allergies  Allergen Reactions  . Penicillins Rash     Current Outpatient Medications:  .  ALPRAZolam (XANAX) 0.5 MG tablet, Take 1 tablet (0.5 mg total) by mouth daily as needed., Disp: 10 tablet, Rfl: 0 .  sertraline (ZOLOFT) 100 MG tablet, Take 1 tablet (100 mg total) by mouth daily., Disp: 90 tablet, Rfl: 3  Review of Systems  Constitutional: Positive for fever. Negative for appetite change, chills, fatigue and weight loss.  HENT: Positive for congestion, sinus pressure and sinus pain. Negative for ear pain, postnasal drip, rhinorrhea and sore throat.   Respiratory: Positive for cough and shortness of breath. Negative for hemoptysis, chest tightness and wheezing.   Cardiovascular: Negative for chest pain and palpitations.  Gastrointestinal: Negative for abdominal pain, heartburn, nausea and vomiting.  Musculoskeletal: Positive for myalgias.  Skin: Negative for rash.    Neurological: Positive for headaches. Negative for dizziness and weakness.    Social History   Tobacco Use  . Smoking status: Never Smoker  . Smokeless tobacco: Never Used  Substance Use Topics  . Alcohol use: Yes    Comment: glass of wine 6 days a week      Objective:   BP 122/81 (BP Location: Left Arm, Patient Position: Sitting, Cuff Size: Large)   Pulse 68   Temp 97.8 F (36.6 C) (Oral)   Resp 16   Wt 150 lb (68 kg)   SpO2 99%   BMI 23.49 kg/m  Vitals:   11/11/18 1549  BP: 122/81  Pulse: 68  Resp: 16  Temp: 97.8 F (36.6 C)  TempSrc: Oral  SpO2: 99%  Weight: 150 lb (68 kg)     Physical Exam Vitals signs reviewed.  Constitutional:      General: She is not in acute distress.    Appearance: She is well-developed. She is not diaphoretic.  HENT:     Head: Normocephalic and atraumatic.     Right Ear: Hearing, tympanic membrane, ear canal and external ear normal.     Left Ear: Hearing, tympanic membrane, ear canal and external ear normal.     Nose:     Right Sinus: Maxillary sinus tenderness and frontal sinus tenderness present.     Left Sinus: Maxillary sinus tenderness and frontal sinus tenderness present.     Mouth/Throat:     Pharynx: Uvula midline. No oropharyngeal exudate.  Neck:     Musculoskeletal: Normal range of motion and neck supple.     Thyroid: No thyromegaly.  Trachea: No tracheal deviation.  Cardiovascular:     Rate and Rhythm: Normal rate and regular rhythm.     Heart sounds: Normal heart sounds. No murmur. No friction rub. No gallop.   Pulmonary:     Effort: Pulmonary effort is normal. No respiratory distress.     Breath sounds: Normal breath sounds. No stridor. No wheezing or rales.  Lymphadenopathy:     Cervical: No cervical adenopathy.         Assessment & Plan    1. Acute non-recurrent pansinusitis Worsening symptoms that have not responded to OTC medications. Will give Doxycycline as below. Add flonase for post nasal drip.  Continue allergy medications. Stay well hydrated and get plenty of rest. Call if no symptom improvement or if symptoms worsen. - doxycycline (VIBRA-TABS) 100 MG tablet; Take 1 tablet (100 mg total) by mouth 2 (two) times daily.  Dispense: 20 tablet; Refill: 0 - fluticasone (FLONASE SENSIMIST) 27.5 MCG/SPRAY nasal spray; Place 2 sprays into the nose daily.  Dispense: 10 g; Refill: [place], PA-C  [facility]

## 2018-11-11 NOTE — Patient Instructions (Signed)
Sinusitis, Adult  Sinusitis is inflammation of your sinuses. Sinuses are hollow spaces in the bones around your face. Your sinuses are located:   Around your eyes.   In the middle of your forehead.   Behind your nose.   In your cheekbones.  Mucus normally drains out of your sinuses. When your nasal tissues become inflamed or swollen, mucus can become trapped or blocked. This allows bacteria, viruses, and fungi to grow, which leads to infection. Most infections of the sinuses are caused by a virus.  Sinusitis can develop quickly. It can last for up to 4 weeks (acute) or for more than 12 weeks (chronic). Sinusitis often develops after a cold.  What are the causes?  This condition is caused by anything that creates swelling in the sinuses or stops mucus from draining. This includes:   Allergies.   Asthma.   Infection from bacteria or viruses.   Deformities or blockages in your nose or sinuses.   Abnormal growths in the nose (nasal polyps).   Pollutants, such as chemicals or irritants in the air.   Infection from fungi (rare).  What increases the risk?  You are more likely to develop this condition if you:   Have a weak body defense system (immune system).   Do a lot of swimming or diving.   Overuse nasal sprays.   Smoke.  What are the signs or symptoms?  The main symptoms of this condition are pain and a feeling of pressure around the affected sinuses. Other symptoms include:   Stuffy nose or congestion.   Thick drainage from your nose.   Swelling and warmth over the affected sinuses.   Headache.   Upper toothache.   A cough that may get worse at night.   Extra mucus that collects in the throat or the back of the nose (postnasal drip).   Decreased sense of smell and taste.   Fatigue.   A fever.   Sore throat.   Bad breath.  How is this diagnosed?  This condition is diagnosed based on:   Your symptoms.   Your medical history.   A physical exam.   Tests to find out if your condition is  acute or chronic. This may include:  ? Checking your nose for nasal polyps.  ? Viewing your sinuses using a device that has a light (endoscope).  ? Testing for allergies or bacteria.  ? Imaging tests, such as an MRI or CT scan.  In rare cases, a bone biopsy may be done to rule out more serious types of fungal sinus disease.  How is this treated?  Treatment for sinusitis depends on the cause and whether your condition is chronic or acute.   If caused by a virus, your symptoms should go away on their own within 10 days. You may be given medicines to relieve symptoms. They include:  ? Medicines that shrink swollen nasal passages (topical intranasal decongestants).  ? Medicines that treat allergies (antihistamines).  ? A spray that eases inflammation of the nostrils (topical intranasal corticosteroids).  ? Rinses that help get rid of thick mucus in your nose (nasal saline washes).   If caused by bacteria, your health care provider may recommend waiting to see if your symptoms improve. Most bacterial infections will get better without antibiotic medicine. You may be given antibiotics if you have:  ? A severe infection.  ? A weak immune system.   If caused by narrow nasal passages or nasal polyps, you may need   to have surgery.  Follow these instructions at home:  Medicines   Take, use, or apply over-the-counter and prescription medicines only as told by your health care provider. These may include nasal sprays.   If you were prescribed an antibiotic medicine, take it as told by your health care provider. Do not stop taking the antibiotic even if you start to feel better.  Hydrate and humidify     Drink enough fluid to keep your urine pale yellow. Staying hydrated will help to thin your mucus.   Use a cool mist humidifier to keep the humidity level in your home above 50%.   Inhale steam for 10-15 minutes, 3-4 times a day, or as told by your health care provider. You can do this in the bathroom while a hot shower is  running.   Limit your exposure to cool or dry air.  Rest   Rest as much as possible.   Sleep with your head raised (elevated).   Make sure you get enough sleep each night.  General instructions     Apply a warm, moist washcloth to your face 3-4 times a day or as told by your health care provider. This will help with discomfort.   Wash your hands often with soap and water to reduce your exposure to germs. If soap and water are not available, use hand sanitizer.   Do not smoke. Avoid being around people who are smoking (secondhand smoke).   Keep all follow-up visits as told by your health care provider. This is important.  Contact a health care provider if:   You have a fever.   Your symptoms get worse.   Your symptoms do not improve within 10 days.  Get help right away if:   You have a severe headache.   You have persistent vomiting.   You have severe pain or swelling around your face or eyes.   You have vision problems.   You develop confusion.   Your neck is stiff.   You have trouble breathing.  Summary   Sinusitis is soreness and inflammation of your sinuses. Sinuses are hollow spaces in the bones around your face.   This condition is caused by nasal tissues that become inflamed or swollen. The swelling traps or blocks the flow of mucus. This allows bacteria, viruses, and fungi to grow, which leads to infection.   If you were prescribed an antibiotic medicine, take it as told by your health care provider. Do not stop taking the antibiotic even if you start to feel better.   Keep all follow-up visits as told by your health care provider. This is important.  This information is not intended to replace advice given to you by your health care provider. Make sure you discuss any questions you have with your health care provider.  Document Released: 10/02/2005 Document Revised: 03/04/2018 Document Reviewed: 03/04/2018  Elsevier Interactive Patient Education  2019 [employer]

## 2019-05-05 ENCOUNTER — Other Ambulatory Visit: Payer: Self-pay

## 2019-05-05 DIAGNOSIS — Z20822 Contact with and (suspected) exposure to covid-19: Secondary | ICD-10-CM

## 2019-05-08 LAB — NOVEL CORONAVIRUS, NAA: SARS-CoV-2, NAA: NOT DETECTED

## 2019-05-09 ENCOUNTER — Ambulatory Visit (INDEPENDENT_AMBULATORY_CARE_PROVIDER_SITE_OTHER): Payer: PRIVATE HEALTH INSURANCE | Admitting: Physician Assistant

## 2019-05-09 ENCOUNTER — Other Ambulatory Visit: Payer: Self-pay

## 2019-05-09 DIAGNOSIS — R0602 Shortness of breath: Secondary | ICD-10-CM | POA: Diagnosis not present

## 2019-05-09 DIAGNOSIS — R5383 Other fatigue: Secondary | ICD-10-CM

## 2019-05-09 NOTE — Progress Notes (Signed)
Patient: Elizabeth Walter Female    DOB: July 07, 1961   58 y.o.   MRN: [id number] Visit Date: 05/09/2019  Today's Provider: Trinna Post, PA-C   Chief Complaint  Patient presents with  . Sore Throat  . Shortness of Breath   Subjective:     Virtual Visit via Telephone Note  I connected with Yevonne Aline on 05/09/19 at 10:20 AM EDT by telephone and verified that I am speaking with the correct person using two identifiers.  Location: Patient: Home Provider: Home   I discussed the limitations, risks, security and privacy concerns of performing an evaluation and management service by telephone and the availability of in person appointments. I also discussed with the patient that there may be a patient responsible charge related to this service. The patient expressed understanding and agreed to proceed.  HPI  Patient calling with c/o sore throat SOB, and headache. Reports that this started on Sunday. SOB has started for a couple of weeks, which is unusual for her. Denies fever, ankle swelling. Some chest pain when inhaling deeply. Patient was tested cor COVID-19 and Test came back Negative yesterday. Reports that she is not feeling any better. Currently works but hasn't been in the office in a week. Never smoker. No nausea, vomiting, diarrhea.   Allergies  Allergen Reactions  . Penicillins Rash     Current Outpatient Medications:  .  fluticasone (FLONASE SENSIMIST) 27.5 MCG/SPRAY nasal spray, Place 2 sprays into the nose daily., Disp: 10 g, Rfl: 12 .  sertraline (ZOLOFT) 100 MG tablet, Take 1 tablet (100 mg total) by mouth daily., Disp: 90 tablet, Rfl: 3 .  ALPRAZolam (XANAX) 0.5 MG tablet, Take 1 tablet (0.5 mg total) by mouth daily as needed. (Patient not taking: Reported on 05/09/2019), Disp: 10 tablet, Rfl: 0 .  doxycycline (VIBRA-TABS) 100 MG tablet, Take 1 tablet (100 mg total) by mouth 2 (two) times daily. (Patient not taking: Reported on 05/09/2019), Disp: 20 tablet, Rfl: 0  Review of Systems  HENT: Positive for sore throat.   Respiratory: Positive for shortness of breath.   Neurological: Positive for headaches.    Social History   Tobacco Use  . Smoking status: Never Smoker  . Smokeless tobacco: Never Used  Substance Use Topics  . Alcohol use: Yes    Comment: glass of wine 6 days a week      Objective:   There were no vitals taken for this visit. There were no vitals filed for this visit.   Physical Exam   No results found for any visits on 05/09/19.     Assessment & Plan    1. Shortness of breath  COVID testing negative, differential remains broad. DDx: viral URI, bronchitis, cardiac etiology, anemia. Patient would like to observe for now and update her labwork. Declines inhaler and steroids currently. Advised she may get her labs in the hospital lab now or in our clinic when she is asymptomatic.   - CBC with Differential - TSH - Lipid Profile - Comprehensive Metabolic Panel (CMET)  2. Other fatigue  - CBC with Differential - TSH - Lipid Profile - Comprehensive Metabolic Panel (CMET)  I discussed the assessment and treatment plan with the patient. The patient was provided an opportunity to ask questions and all were answered. The patient agreed with the plan and demonstrated an understanding of the instructions.   The patient was advised to call back or seek an in-person evaluation if the symptoms worsen or  if the condition fails to improve as anticipated.  The entirety of the information documented in the History of Present Illness, Review of Systems and Physical Exam were personally obtained by me. Portions of this information were initially documented by Lyndel Pleasure, CMA and reviewed by me for thoroughness and accuracy.   Trinna Post, PA-C  [facility]

## 2019-05-20 LAB — COMPREHENSIVE METABOLIC PANEL
ALT: 16 IU/L (ref 0–32)
AST: 22 IU/L (ref 0–40)
Albumin/Globulin Ratio: 2.2 (ref 1.2–2.2)
Albumin: 4.1 g/dL (ref 3.8–4.9)
Alkaline Phosphatase: 74 IU/L (ref 39–117)
BUN/Creatinine Ratio: 15 (ref 9–23)
BUN: 12 mg/dL (ref 6–24)
Bilirubin Total: 0.3 mg/dL (ref 0.0–1.2)
CO2: 23 mmol/L (ref 20–29)
Calcium: 9 mg/dL (ref 8.7–10.2)
Chloride: 104 mmol/L (ref 96–106)
Creatinine, Ser: 0.79 mg/dL (ref 0.57–1.00)
GFR calc Af Amer: 97 mL/min/{1.73_m2} (ref >59)
GFR calc non Af Amer: 84 mL/min/{1.73_m2} (ref >59)
Globulin, Total: 1.9 g/dL (ref 1.5–4.5)
Glucose: 89 mg/dL (ref 65–99)
Potassium: 4.3 mmol/L (ref 3.5–5.2)
Sodium: 141 mmol/L (ref 134–144)
Total Protein: 6 g/dL (ref 6.0–8.5)

## 2019-05-20 LAB — CBC WITH DIFFERENTIAL/PLATELET
Basophils Absolute: 0 10*3/uL (ref 0.0–0.2)
Basos: 1 %
EOS (ABSOLUTE): 0.1 10*3/uL (ref 0.0–0.4)
Eos: 2 %
Hematocrit: 39.4 % (ref 34.0–46.6)
Hemoglobin: 13.2 g/dL (ref 11.1–15.9)
Immature Grans (Abs): 0 10*3/uL (ref 0.0–0.1)
Immature Granulocytes: 0 %
Lymphocytes Absolute: 1.6 10*3/uL (ref 0.7–3.1)
Lymphs: 34 %
MCH: 29.6 pg (ref 26.6–33.0)
MCHC: 33.5 g/dL (ref 31.5–35.7)
MCV: 88 fL (ref 79–97)
Monocytes Absolute: 0.3 10*3/uL (ref 0.1–0.9)
Monocytes: 7 %
Neutrophils Absolute: 2.6 10*3/uL (ref 1.4–7.0)
Neutrophils: 56 %
Platelets: 214 10*3/uL (ref 150–450)
RBC: 4.46 x10E6/uL (ref 3.77–5.28)
RDW: 12.7 % (ref 11.7–15.4)
WBC: 4.7 10*3/uL (ref 3.4–10.8)

## 2019-05-20 LAB — LIPID PANEL
Chol/HDL Ratio: 2.4 ratio (ref 0.0–4.4)
Cholesterol, Total: 186 mg/dL (ref 100–199)
HDL: 79 mg/dL (ref >39)
LDL Calculated: 90 mg/dL (ref 0–99)
Triglycerides: 85 mg/dL (ref 0–149)
VLDL Cholesterol Cal: 17 mg/dL (ref 5–40)

## 2019-05-20 LAB — TSH: TSH: 2.03 u[IU]/mL (ref 0.450–4.500)

## 2019-05-21 ENCOUNTER — Telehealth: Payer: Self-pay

## 2019-05-21 NOTE — Telephone Encounter (Signed)
Patient advised as below.  

## 2019-05-21 NOTE — Telephone Encounter (Signed)
-----   Message from Trinna Post, [place] sent at 05/21/2019  9:12 AM EDT ----- Labs normal.

## 2019-06-27 ENCOUNTER — Other Ambulatory Visit: Payer: Self-pay | Admitting: Family Medicine

## 2019-06-27 DIAGNOSIS — F32A Depression, unspecified: Secondary | ICD-10-CM

## 2019-06-27 DIAGNOSIS — F419 Anxiety disorder, unspecified: Secondary | ICD-10-CM

## 2019-06-27 DIAGNOSIS — F329 Major depressive disorder, single episode, unspecified: Secondary | ICD-10-CM

## 2019-07-29 ENCOUNTER — Ambulatory Visit (INDEPENDENT_AMBULATORY_CARE_PROVIDER_SITE_OTHER): Payer: PRIVATE HEALTH INSURANCE

## 2019-07-29 ENCOUNTER — Other Ambulatory Visit: Payer: Self-pay

## 2019-07-29 DIAGNOSIS — Z23 Encounter for immunization: Secondary | ICD-10-CM | POA: Diagnosis not present

## 2019-11-06 ENCOUNTER — Other Ambulatory Visit: Payer: Self-pay | Admitting: Family Medicine

## 2019-11-06 DIAGNOSIS — Z1231 Encounter for screening mammogram for malignant neoplasm of breast: Secondary | ICD-10-CM

## 2019-11-17 ENCOUNTER — Ambulatory Visit
Admission: RE | Admit: 2019-11-17 | Discharge: 2019-11-17 | Disposition: A | Discharge status: Home / Self Care | Payer: No Typology Code available for payment source | Source: Ambulatory Visit | Attending: Family Medicine | Admitting: Family Medicine

## 2019-11-17 ENCOUNTER — Encounter (INDEPENDENT_AMBULATORY_CARE_PROVIDER_SITE_OTHER): Payer: Self-pay

## 2019-11-17 ENCOUNTER — Other Ambulatory Visit: Payer: Self-pay

## 2019-11-17 DIAGNOSIS — Z1231 Encounter for screening mammogram for malignant neoplasm of breast: Secondary | ICD-10-CM | POA: Insufficient documentation

## 2020-07-07 ENCOUNTER — Other Ambulatory Visit: Payer: Self-pay

## 2020-07-07 ENCOUNTER — Ambulatory Visit (INDEPENDENT_AMBULATORY_CARE_PROVIDER_SITE_OTHER): Payer: No Typology Code available for payment source | Admitting: Dermatology

## 2020-07-07 DIAGNOSIS — L988 Other specified disorders of the skin and subcutaneous tissue: Secondary | ICD-10-CM | POA: Diagnosis not present

## 2020-07-07 DIAGNOSIS — B079 Viral wart, unspecified: Secondary | ICD-10-CM | POA: Diagnosis not present

## 2020-07-07 DIAGNOSIS — L918 Other hypertrophic disorders of the skin: Secondary | ICD-10-CM

## 2020-07-07 DIAGNOSIS — D489 Neoplasm of uncertain behavior, unspecified: Secondary | ICD-10-CM

## 2020-07-07 MED ORDER — ALTRENO 0.05 % EX LOTN: TOPICAL_LOTION | CUTANEOUS | 1 refills | Status: DC

## 2020-07-07 NOTE — Patient Instructions (Addendum)
Recommend daily broad spectrum sunscreen SPF 30+ to sun-exposed areas, reapply every 2 hours as needed. Call for new or changing lesions.  Discussed viral etiology and risk of spread.  Discussed multiple treatments may be required to clear warts.  Discussed possible post-treatment dyspigmentation and risk of recurrence.  Recommend taking Heliocare sun protection supplement daily in sunny weather for additional sun protection. For maximum protection on the sunniest days, you can take up to 2 capsules of regular Heliocare OR take 1 capsule of Heliocare Ultra. For prolonged exposure (such as a full day in the sun), you can repeat your dose of the supplement 4 hours after your first dose. Heliocare can be purchased at [facility] or at [email or web address].   Face:  Apply pea size amount to entire face every other night for first 2 weeks then increase as tolerated.  Wart:  Purchase Over the counter Salicylic acid for wart.    We will send in [facility] today, apply to just the wart and try to avoid the surrounding area at night and cover with Duck tape, pull off tape in morning.    WartPEEL is a special compounded medicine (5-fluorouracil/salicylic acid) used to treat warts. It works much faster than over the counter salicylic acid but is not paid for by insurance. It should be applied to the wart once a day and covered with a bandage. Care should be taken to avoid applying it to the normal skin surrounding the wart in order to avoid irritation. It should not be used by pregnant women.

## 2020-07-07 NOTE — Progress Notes (Signed)
   Follow-Up Visit   Subjective  Elizabeth Walter is a 59 y.o. female who presents for the following: Areas of concern.  Patient presents today for a few areas of concern to be evaluated on chest, under b/l arms, right ring finger and would like to discuss Retin A for her face.  The following portions of the chart were reviewed this encounter and updated as appropriate:  Tobacco  Allergies  Meds  Problems  Med Hx  Surg Hx  Fam Hx      Review of Systems:  No other skin or systemic complaints except as noted in HPI or Assessment and Plan.  Objective  Well appearing patient in no apparent distress; mood and affect are within normal limits.  A focused examination was performed including Face, B/L Axilla, chest, hands, fingernails. Relevant physical exam findings are noted in the Assessment and Plan.  Objective  Right 4th Finger Tip (3): Verrucous papules x 3  Objective  Right Buccal Cheek : Rhytides and volume loss.   Objective  Left Axilla, Right Axilla (2): Fleshy, skin-colored pedunculated papules.    Objective  Right Axilla: 0.2 erythematous papule  Objective  Chest - Medial [facility]): 0.5 thin pink macule with round globules and focused 0.1 cm waxy brown papule  @ inferior border No suspicious vessels seen under dermoscopy   Assessment & Plan  Viral warts, unspecified type (3) Right 4th Finger Tip  Discussed viral etiology and risk of spread.  Discussed multiple treatments may be required to clear warts.  Discussed possible post-treatment dyspigmentation and risk of recurrence.  Discussed Catherone plus, at home wart treatment from [facility] and cryotherapy.  Will send in [place] (5-fluorouracil/salicylic acid) She will retry OTC salicylic acid under duct tape first and switch to [place] if desired. Reviewed appropriate use and expected side effects of WartPEEL (5-fluorouracil/salicylic acid)   Elastosis of skin Right Buccal Cheek   Discussed Tretinoin and  [place], patient will call clinic with correct strength and then will send it  Topical retinoid medications like tretinoin/Retin-A, adapalene/Differin, tazarotene/Fabior, and Epiduo/Epiduo Forte can cause dryness and irritation when first started. Only apply a pea-sized amount to the entire affected area. Avoid applying it around the eyes, edges of mouth and creases at the nose. If you experience irritation, use a good moisturizer first and/or apply the medicine less often. If you are doing well with the medicine, you can increase how often you use it until you are applying every night. Be careful with sun protection while using this medication as it can make you sensitive to the sun. This medicine should not be used by pregnant women.    Tretinoin (ALTRENO) 0.05 % LOTN - Right Buccal Cheek   Skin tag (3) Left Axilla; Right Axilla (2)  Discussed removal options, Patient defers at this time.  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  I, Donzetta Kohut, CMA, am acting as scribe for Forest Gleason, MD .  Documentation: I have reviewed the above documentation for accuracy and completeness, and I agree with the above.  Forest Gleason, MD

## 2020-07-08 ENCOUNTER — Encounter: Payer: Self-pay | Admitting: Dermatology

## 2020-07-19 ENCOUNTER — Other Ambulatory Visit: Payer: Self-pay

## 2020-07-19 MED ORDER — TRETINOIN 0.05 % EX CREA: TOPICAL_CREAM | Freq: Every evening | CUTANEOUS | 0 refills | Status: AC

## 2020-07-19 NOTE — Progress Notes (Signed)
RF for generic Tretinoin.

## 2020-08-10 ENCOUNTER — Ambulatory Visit: Payer: No Typology Code available for payment source | Admitting: Dermatology

## 2020-09-07 ENCOUNTER — Telehealth: Payer: Self-pay | Admitting: Family Medicine

## 2020-09-07 NOTE — Telephone Encounter (Signed)
Left message to advise patient of upcoming flu shot appts on 11/30

## 2020-09-13 ENCOUNTER — Ambulatory Visit: Payer: PRIVATE HEALTH INSURANCE | Admitting: Physician Assistant

## 2020-09-14 ENCOUNTER — Other Ambulatory Visit: Payer: Self-pay

## 2020-09-14 ENCOUNTER — Ambulatory Visit (INDEPENDENT_AMBULATORY_CARE_PROVIDER_SITE_OTHER): Payer: PRIVATE HEALTH INSURANCE | Admitting: Family Medicine

## 2020-09-14 DIAGNOSIS — F32A Depression, unspecified: Secondary | ICD-10-CM

## 2020-09-14 DIAGNOSIS — Z23 Encounter for immunization: Secondary | ICD-10-CM | POA: Diagnosis not present

## 2020-09-14 DIAGNOSIS — F419 Anxiety disorder, unspecified: Secondary | ICD-10-CM

## 2020-09-14 MED ORDER — SERTRALINE HCL 100 MG PO TABS: 100.0000 mg | ORAL_TABLET | Freq: Every day | ORAL | 1 refills | Status: DC

## 2020-10-21 ENCOUNTER — Other Ambulatory Visit: Payer: Self-pay | Admitting: Family Medicine

## 2020-10-21 DIAGNOSIS — Z1231 Encounter for screening mammogram for malignant neoplasm of breast: Secondary | ICD-10-CM

## 2020-11-18 ENCOUNTER — Other Ambulatory Visit: Payer: Self-pay

## 2020-11-18 ENCOUNTER — Ambulatory Visit
Admission: RE | Admit: 2020-11-18 | Discharge: 2020-11-18 | Disposition: A | Discharge status: Home / Self Care | Payer: PRIVATE HEALTH INSURANCE | Source: Ambulatory Visit | Attending: Family Medicine | Admitting: Family Medicine

## 2020-11-18 DIAGNOSIS — Z1231 Encounter for screening mammogram for malignant neoplasm of breast: Secondary | ICD-10-CM | POA: Insufficient documentation

## 2020-11-24 ENCOUNTER — Ambulatory Visit: Payer: Self-pay

## 2020-11-24 NOTE — Telephone Encounter (Signed)
Patient called and says since Sunday she's been having mild SOB, cough. She says she has taken 2 home COVID tests that were negative, one on Monday and one yesterday. She denies fever, but says she has chills. She says the SOB is only when up moving around, going up stairs, no SOB at rest. She says other symptoms are sinus/chest congestion, runny nose, slight throat irritation from the drainage and slight wheezing. I advised no availability with PCP until Monday, 11/29/20. I called the office and spoke to [place], [facility] who says she can be scheduled with Fabio Bering tomorrow. I advised patient virtual appointment scheduled for tomorrow at 1540, care advice given, patient verbalized understanding.  Reason for Disposition . [1] MILD difficulty breathing (e.g., minimal/no SOB at rest, SOB with walking, pulse <100) AND [2] NEW-onset or WORSE than normal  Answer Assessment - Initial Assessment Questions 1. RESPIRATORY STATUS: "Describe your breathing?" (e.g., wheezing, shortness of breath, unable to speak, severe coughing)      Shortness of breath, a little wheezing, coughing 2. ONSET: "When did this breathing problem begin?"      Sunday 3. PATTERN "Does the difficult breathing come and go, or has it been constant since it started?"      Comes and goes with activity 4. SEVERITY: "How bad is your breathing?" (e.g., mild, moderate, severe)    - MILD: No SOB at rest, mild SOB with walking, speaks normally in sentences, can lay down, no retractions, pulse < 100.    - MODERATE: SOB at rest, SOB with minimal exertion and prefers to sit, cannot lie down flat, speaks in phrases, mild retractions, audible wheezing, pulse 100-120.    - SEVERE: Very SOB at rest, speaks in single words, struggling to breathe, sitting hunched forward, retractions, pulse > 120      Mild 5. RECURRENT SYMPTOM: "Have you had difficulty breathing before?" If Yes, ask: "When was the last time?" and "What happened that time?"      No 6. CARDIAC  HISTORY: "Do you have any history of heart disease?" (e.g., heart attack, angina, bypass surgery, angioplasty)      No 7. LUNG HISTORY: "Do you have any history of lung disease?"  (e.g., pulmonary embolus, asthma, emphysema)     No 8. CAUSE: "What do you think is causing the breathing problem?"      I don't know, thinking it's a bad cold 9. OTHER SYMPTOMS: "Do you have any other symptoms? (e.g., dizziness, runny nose, cough, chest pain, fever)     Cough, sinus congestion, chest congestion, chills, runny nose 10. PREGNANCY: "Is there any chance you are pregnant?" "When was your last menstrual period?"       No 11. TRAVEL: "Have you traveled out of the country in the last month?" (e.g., travel history, exposures)       No  Protocols used: BREATHING DIFFICULTY-A-AH

## 2020-11-25 ENCOUNTER — Telehealth (INDEPENDENT_AMBULATORY_CARE_PROVIDER_SITE_OTHER): Payer: PRIVATE HEALTH INSURANCE | Admitting: Physician Assistant

## 2020-11-25 DIAGNOSIS — Z20822 Contact with and (suspected) exposure to covid-19: Secondary | ICD-10-CM

## 2020-11-25 NOTE — Progress Notes (Signed)
MyChart Video Visit    Virtual Visit via Video Note   This visit type was conducted due to national recommendations for restrictions regarding the COVID-19 Pandemic (e.g. social distancing) in an effort to limit this patient's exposure and mitigate transmission in our community. This patient is at least at moderate risk for complications without adequate follow up. This format is felt to be most appropriate for this patient at this time. Physical exam was limited by quality of the video and audio technology used for the visit.   Patient location: Home Provider location: office   I discussed the limitations of evaluation and management by telemedicine and the availability of in person appointments. The patient expressed understanding and agreed to proceed.  Patient: Elizabeth Walter   DOB: 09-Apr-1961   60 y.o. Female  MRN: [id number] Visit Date: 11/25/2020  Today's healthcare provider: Trinna Post, PA-C   Chief Complaint  Patient presents with  . URI  I,Adriana M Pollak,acting as a scribe for [employer], PA-C.,have documented all relevant documentation on the behalf of Trinna Post, PA-C,as directed by  Trinna Post, PA-C while in the presence of Trinna Post, PA-C.  Subjective    URI  This is a new problem. The current episode started in the past 7 days. The problem has been gradually worsening. There has been no fever. Associated symptoms include congestion, coughing, diarrhea, headaches, rhinorrhea, sinus pain, sneezing and a sore throat. Pertinent negatives include no ear pain, nausea, vomiting or wheezing. She has tried increased fluids for the symptoms.     She had symptoms starting Sunday 11/21/2020. She had two home tests since then which have been negative. She got a PCR test this morning and she is waiting on a result.    Medications: Outpatient Medications Prior to Visit  Medication Sig  . ALPRAZolam (XANAX) 0.5 MG tablet Take 1 tablet (0.5 mg total)  by mouth daily as needed.  . fluticasone (FLONASE SENSIMIST) 27.5 MCG/SPRAY nasal spray Place 2 sprays into the nose daily.  . sertraline (ZOLOFT) 100 MG tablet Take 1 tablet (100 mg total) by mouth daily.  Marland Kitchen tretinoin (RETIN-A) 0.05 % cream Apply topically at bedtime.  Marland Kitchen doxycycline (VIBRA-TABS) 100 MG tablet Take 1 tablet (100 mg total) by mouth 2 (two) times daily. (Patient not taking: No sig reported)   No facility-administered medications prior to visit.    Review of Systems  Constitutional: Positive for appetite change and chills. Negative for fatigue and fever.  HENT: Positive for congestion, rhinorrhea, sinus pressure, sinus pain, sneezing and sore throat. Negative for ear pain and postnasal drip.   Respiratory: Positive for cough and shortness of breath. Negative for chest tightness and wheezing.   Gastrointestinal: Positive for diarrhea. Negative for nausea and vomiting.  Neurological: Positive for weakness and headaches.      Objective    There were no vitals taken for this visit.   Physical Exam Constitutional:      Appearance: Normal appearance.  Pulmonary:     Effort: Pulmonary effort is normal. No respiratory distress.  Neurological:     Mental Status: She is alert.  Psychiatric:        Mood and Affect: Mood normal.        Behavior: Behavior normal.        Assessment & Plan    1. Suspected COVID-19 virus infection  Your COVID test is positive. You should remain isolated and quarantine for at least 5 days from  start of symptoms. You must be feeling better and be fever free without any fever reducers for at least 24 hours as well. You should wear a mask at all times when out of your home or around others for 5 days after leaving isolation.  Your household contacts should be tested as well as work contacts. If you feel worse or have increasing shortness of breath, you should be seen in person at urgent care or the emergency room.       No follow-ups on file.      I discussed the assessment and treatment plan with the patient. The patient was provided an opportunity to ask questions and all were answered. The patient agreed with the plan and demonstrated an understanding of the instructions.   The patient was advised to call back or seek an in-person evaluation if the symptoms worsen or if the condition fails to improve as anticipated.   ITrinna Post, PA-C, have reviewed all documentation for this visit. The documentation on 11/25/20 for the exam, diagnosis, procedures, and orders are all accurate and complete.  The entirety of the information documented in the History of Present Illness, Review of Systems and Physical Exam were personally obtained by me. Portions of this information were initially documented by [facility] and reviewed by me for thoroughness and accuracy.    Paulene Floor [facility] [phone] (phone) [phone] (fax)  [place]

## 2020-12-02 ENCOUNTER — Other Ambulatory Visit: Payer: Self-pay

## 2020-12-02 ENCOUNTER — Telehealth (INDEPENDENT_AMBULATORY_CARE_PROVIDER_SITE_OTHER): Payer: No Typology Code available for payment source | Admitting: Adult Health

## 2020-12-02 ENCOUNTER — Ambulatory Visit: Payer: Self-pay | Admitting: *Deleted

## 2020-12-02 ENCOUNTER — Encounter: Payer: Self-pay | Admitting: Adult Health

## 2020-12-02 DIAGNOSIS — R059 Cough, unspecified: Secondary | ICD-10-CM | POA: Diagnosis not present

## 2020-12-02 DIAGNOSIS — U071 COVID-19: Secondary | ICD-10-CM

## 2020-12-02 DIAGNOSIS — J40 Bronchitis, not specified as acute or chronic: Secondary | ICD-10-CM | POA: Diagnosis not present

## 2020-12-02 MED ORDER — PREDNISONE 10 MG (21) PO TBPK: ORAL_TABLET | ORAL | 0 refills | Status: DC

## 2020-12-02 MED ORDER — ALBUTEROL SULFATE HFA 108 (90 BASE) MCG/ACT IN AERS: 1.0000 | INHALATION_SPRAY | Freq: Four times a day (QID) | RESPIRATORY_TRACT | 0 refills | Status: DC | PRN

## 2020-12-02 NOTE — Patient Instructions (Signed)
Cough, Adult A cough helps to clear your throat and lungs. A cough may be a sign of an illness or another medical condition. An acute cough may only last 2-3 weeks, while a chronic cough may last 8 or more weeks. Many things can cause a cough. They include:  Germs (viruses or bacteria) that attack the airway.  Breathing in things that bother (irritate) your lungs.  Allergies.  Asthma.  Mucus that runs down the back of your throat (postnasal drip).  Smoking.  Acid backing up from the stomach into the tube that moves food from the mouth to the stomach (gastroesophageal reflux).  Some medicines.  Lung problems.  Other medical conditions, such as heart failure or a blood clot in the lung (pulmonary embolism). Follow these instructions at home: Medicines  Take over-the-counter and prescription medicines only as told by your doctor.  Talk with your doctor before you take medicines that stop a cough (cough suppressants). Lifestyle  Do not smoke, and try not to be around smoke. Do not use any products that contain nicotine or tobacco, such as cigarettes, e-cigarettes, and chewing tobacco. If you need help quitting, ask your doctor.  Drink enough fluid to keep your pee (urine) pale yellow.  Avoid caffeine.  Do not drink alcohol if your doctor tells you not to drink.   General instructions  Watch for any changes in your cough. Tell your doctor about them.  Always cover your mouth when you cough.  Stay away from things that make you cough, such as perfume, candles, campfire smoke, or cleaning products.  If the air is dry, use a cool mist vaporizer or humidifier in your home.  If your cough is worse at night, try using extra pillows to raise your head up higher while you sleep.  Rest as needed.  Keep all follow-up visits as told by your doctor. This is important.   Contact a doctor if:  You have new symptoms.  You cough up pus.  Your cough does not get better after 2-3  weeks, or your cough gets worse.  Cough medicine does not help your cough and you are not sleeping well.  You have pain that gets worse or pain that is not helped with medicine.  You have a fever.  You are losing weight and you do not know why.  You have night sweats. Get help right away if:  You cough up blood.  You have trouble breathing.  Your heartbeat is very fast. These symptoms may be an emergency. Do not wait to see if the symptoms will go away. Get medical help right away. Call your local emergency services (911 in the [place] Do not drive yourself to the hospital. Summary  A cough helps to clear your throat and lungs. Many things can cause a cough.  Take over-the-counter and prescription medicines only as told by your doctor.  Always cover your mouth when you cough.  Contact a doctor if you have new symptoms or you have a cough that does not get better or gets worse. This information is not intended to replace advice given to you by your health care provider. Make sure you discuss any questions you have with your health care provider. Document Revised: 11/21/2019 Document Reviewed: 10/21/2018 Elsevier Patient Education  2021 [place]. Bronchospasm, Adult  Bronchospasm is when the small airways in the lungs narrow. This can make it very hard to breathe. Swelling and more mucus than normal can add to this problem. What are  the causes? Common causes of this condition include:  Having a cold.  Exercise.  The smell from sprays, perfumes, candles, and cleaners.  Cold air.  Stress, laughing, or crying. What increases the risk?  Having asthma.  Smoking.  Having allergies.  Being allergic to certain foods, medicine, or bug bites or stings. What are the signs or symptoms? Symptoms of this condition include:  Making whistling sounds when you breathe (wheezing).  Coughing.  A tight feeling in your chest.  Feeling like you cannot catch your  breath.  Feeling like you have no energy to exercise.  Breathing that is noisy.  A cough that has a high pitch. How is this treated? This condition may be treated by:  Using medicines that you breathe in. These open up the airways and help you breathe. Medicines can be taken with a metered dose inhaler or a nebulizer device.  Taking medicines to reduce swelling.  Getting rid of what started the bronchospasm. Follow these instructions at home: Medicines  Take over-the-counter and prescription medicines only as told by your doctor.  If you need to use an inhaler or nebulizer to take your medicine, ask your doctor how to use it.  You may be given a spacer to use with your inhaler. This makes it easier to get the medicine from the inhaler into your lungs. Lifestyle  Do not use any products that have nicotine or tobacco. This includes cigarettes, e-cigarettes, and chewing tobacco. If you need help quitting, ask your doctor.  Keep track of things that start your bronchospasm. Avoid these if you are able to.  When pollen, air pollution, or humidity is bad, keep windows closed. Use an air conditioner if you have one.  Find ways to cope with stress and your feelings. Activity Some people have bronchospasm when they exercise. This is called exercise-induced bronchoconstriction (EIB). If you have this problem, talk with your doctor about how to deal with EIB. Some tips include:  Use your inhaler before exercise.  Exercise indoors if it is very cold, humid, or the pollen and mold count is high.  Warm up and cool down before and after exercise.  Stop your exercise right away if your symptoms start or get worse. General instructions  If you have asthma, make sure you have an asthma action plan.  Stay up to date on your shots (immunizations).  Keep all follow-up visits as told by your doctor. This is important. Get help right away if:  You have trouble breathing.  You wheeze and  cough and this does not get better after you take medicine.  You have chest pain.  You have trouble speaking more than one word in a sentence. These symptoms may be an emergency. Do not wait to see if the symptoms will go away. Get medical help right away. Call your local emergency services (911 in the [place] Do not drive yourself to the hospital. Summary  Bronchospasm is when the small airways in the lungs narrow. Swelling and more mucus than normal can add to this problem. This can make it very hard to breathe.  Get help right away if you wheeze and cough and this does not get better after you take medicine.  Do not use any products that have nicotine or tobacco. This includes cigarettes, e-cigarettes, and chewing tobacco. This information is not intended to replace advice given to you by your health care provider. Make sure you discuss any questions you have with your health care provider. Document  Revised: 11/11/2019 Document Reviewed: 11/11/2019 Elsevier Patient Education  2021 [place]. Acute Bronchitis, Adult  Acute bronchitis is when air tubes in the lungs (bronchi) suddenly get swollen. The condition can make it hard for you to breathe. In adults, acute bronchitis usually goes away within 2 weeks. A cough caused by bronchitis may last up to 3 weeks. Smoking, allergies, and asthma can make the condition worse. What are the causes? This condition is caused by:  Cold and flu viruses. The most common cause of this condition is the virus that causes the common cold.  Bacteria.  Substances that irritate the lungs, including: ? Smoke from cigarettes and other types of tobacco. ? Dust and pollen. ? Fumes from chemicals, gases, or burned fuel. ? Other materials that pollute indoor or outdoor air.  Close contact with someone who has acute bronchitis. What increases the risk? The following factors may make you more likely to develop this condition:  A weak body's defense  system. This is also called the immune system.  Any condition that affects your lungs and breathing, such as asthma. What are the signs or symptoms? Symptoms of this condition include:  A cough.  Coughing up clear, yellow, or green mucus.  Wheezing.  Chest congestion.  Shortness of breath.  A fever.  Body aches.  Chills.  A sore throat. How is this treated? Acute bronchitis may go away over time without treatment. Your doctor may recommend:  Drinking more fluids.  Taking a medicine for a fever or cough.  Using a device that gets medicine into your lungs (inhaler).  Using a vaporizer or a humidifier. These are machines that add water or moisture in the air to help with coughing and poor breathing. Follow these instructions at home: Activity  Get a lot of rest.  Avoid places where there are fumes from chemicals.  Return to your normal activities as told by your doctor. Ask your doctor what activities are safe for you. Lifestyle  Drink enough fluids to keep your pee (urine) pale yellow.  Do not drink alcohol.  Do not use any products that contain nicotine or tobacco, such as cigarettes, e-cigarettes, and chewing tobacco. If you need help quitting, ask your doctor. Be aware that: ? Your bronchitis will get worse if you smoke or breathe in other people's smoke (secondhand smoke). ? Your lungs will heal faster if you quit smoking. General instructions  Take over-the-counter and prescription medicines only as told by your doctor.  Use an inhaler, cool mist vaporizer, or humidifier as told by your doctor.  Rinse your mouth often with salt water. To make salt water, dissolve -1 tsp (3-6 g) of salt in 1 cup (237 mL) of warm water.  Keep all follow-up visits as told by your doctor. This is important.   How is this prevented? To lower your risk of getting this condition again:  Wash your hands often with soap and water. If soap and water are not available, use hand  sanitizer.  Avoid contact with people who have cold symptoms.  Try not to touch your mouth, nose, or eyes with your hands.  Make sure to get the flu shot every year.   Contact a doctor if:  Your symptoms do not get better in 2 weeks.  You vomit more than once or twice.  You have symptoms of loss of fluid from your body (dehydration). These include: ? Dark urine. ? Dry skin or eyes. ? Increased thirst. ? Headaches. ? Confusion. ?  Muscle cramps. Get help right away if:  You cough up blood.  You have chest pain.  You have very bad shortness of breath.  You become dehydrated.  You faint or keep feeling like you are going to faint.  You keep vomiting.  You have a very bad headache.  Your fever or chills get worse. These symptoms may be an emergency. Do not wait to see if the symptoms will go away. Get medical help right away. Call your local emergency services (911 in the [place] Do not drive yourself to the hospital. Summary  Acute bronchitis is when air tubes in the lungs (bronchi) suddenly get swollen. In adults, acute bronchitis usually goes away within 2 weeks.  Take over-the-counter and prescription medicines only as told by your doctor.  Drink enough fluid to keep your pee (urine) pale yellow.  Contact a doctor if your symptoms do not improve after 2 weeks of treatment.  Get help right away if you cough up blood, faint, or have chest pain or shortness of breath. This information is not intended to replace advice given to you by your health care provider. Make sure you discuss any questions you have with your health care provider. Document Revised: 04/25/2019 Document Reviewed: 04/25/2019 Elsevier Patient Education  [place] Can Do to Manage Your COVID-19 Symptoms at Home If you have possible or confirmed COVID-19: 1. Stay home except to get medical care. 2. Monitor your symptoms carefully. If your symptoms get worse, call your  healthcare provider immediately. 3. Get rest and stay hydrated. 4. If you have a medical appointment, call the healthcare provider ahead of time and tell them that you have or may have COVID-19. 5. For medical emergencies, call 911 and notify the dispatch personnel that you have or may have COVID-19. 6. Cover your cough and sneezes with a tissue or use the inside of your elbow. 7. Wash your hands often with soap and water for at least 20 seconds or clean your hands with an alcohol-based hand sanitizer that contains at least 60% alcohol. 8. As much as possible, stay in a specific room and away from other people in your home. Also, you should use a separate bathroom, if available. If you need to be around other people in or outside of the home, wear a mask. 9. Avoid sharing personal items with other people in your household, like dishes, towels, and bedding. 10. Clean all surfaces that are touched often, like counters, tabletops, and doorknobs. Use household cleaning sprays or wipes according to the label instructions. [email or web address] 04/30/2020 This information is not intended to replace advice given to you by your health care provider. Make sure you discuss any questions you have with your health care provider. Document Revised: 08/16/2020 Document Reviewed: 08/16/2020 Elsevier Patient Education  2021 [employer].

## 2020-12-02 NOTE — Telephone Encounter (Signed)
Patient is having chest pain with cough and deep breaths.

## 2020-12-02 NOTE — Telephone Encounter (Signed)
Patient is calling to report she is still having cough and pain with deep breath- she is requesting chest Xray- she has scheduled a virtual appointment- she states she will be driving home at 3pm- but she will be ready at 3:20. She does not have my chart so she wants to know if appointment will be through text or Email. Told patient I would let office know. Reason for Disposition . Taking a deep breath makes pain worse  Answer Assessment - Initial Assessment Questions 1. LOCATION: "Where does it hurt?"       Midline pain 2. RADIATION: "Does the pain go anywhere else?" (e.g., into neck, jaw, arms, back)     No radiation 3. ONSET: "When did the chest pain begin?" (Minutes, hours or days)      yesterday 4. PATTERN "Does the pain come and go, or has it been constant since it started?"  "Does it get worse with exertion?"      Constant with breath- SOB with exertion 5. DURATION: "How long does it last" (e.g., seconds, minutes, hours)     seconds 6. SEVERITY: "How bad is the pain?"  (e.g., Scale 1-10; mild, moderate, or severe)    - MILD (1-3): doesn't interfere with normal activities     - MODERATE (4-7): interferes with normal activities or awakens from sleep    - SEVERE (8-10): excruciating pain, unable to do any normal activities       Mild/moderate 7. CARDIAC RISK FACTORS: "Do you have any history of heart problems or risk factors for heart disease?" (e.g., angina, prior heart attack; diabetes, high blood pressure, high cholesterol, smoker, or strong family history of heart disease)     no 8. PULMONARY RISK FACTORS: "Do you have any history of lung disease?"  (e.g., blood clots in lung, asthma, emphysema, birth control pills)     Suspect COVID 9. CAUSE: "What do you think is causing the chest pain?"     Possible- cough and chest congestion 10. OTHER SYMPTOMS: "Do you have any other symptoms?" (e.g., dizziness, nausea, vomiting, sweating, fever, difficulty breathing, cough)       Cough, loss  of taste and smell 11. PREGNANCY: "Is there any chance you are pregnant?" "When was your last menstrual period?"       n/a  Protocols used: CHEST PAIN-A-AH

## 2020-12-02 NOTE — Progress Notes (Addendum)
MyChart Telephone Visit    Virtual Visit via Telephone Note   This visit type was conducted due to national recommendations for restrictions regarding the COVID-19 Pandemic (e.g. social distancing) in an effort to limit this patient's exposure and mitigate transmission in our community. This patient is at least at moderate risk for complications without adequate follow up. This format is felt to be most appropriate for this patient at this time. Physical exam was limited by quality of the video and audio technology used for the visit.  Parties involved in visit as below:   Patient location: at home Provider location: Provider: Provider's office at  [facility], [place].   Patient did not have video connection available.    I discussed the limitations of evaluation and management by telemedicine and the availability of in person appointments. The patient expressed understanding and agreed to proceed.  Patient: Elizabeth Walter   DOB: 08-Aug-1961   60 y.o. Female  MRN: [id number] Visit Date: 12/02/2020  Today's healthcare provider: Marcille Buffy, FNP   Chief Complaint  Patient presents with  . Covid Positive   Subjective    HPI HPI    Patient presents today for follow up visit after being diagnosed with Covid-19 on 11/25/20. Patient reports that she has a lingering cough which is now associated with chest pain. Patient states that she was taking delsym for cough and a otc cough suppressant with no relief, she states that she has also tried otc Mucinex to help relieve congestion.    Last edited by Minette Headland, CMA on 12/02/2020  3:27 PM. (History)      Onset of symptoms were 11/21/2018. She reports her cough has started to worsen, increased chest congestion, she has had chills occasionally, not in last 2 days.  She has a non productive cough.   She has mostly a lot of head congestion and pressure. She does have some chest tightness/ bronchial area.    Patient  denies any fever, body aches,chills, rash,  shortness of breath, nausea, vomiting, or diarrhea.     Medications: Outpatient Medications Prior to Visit  Medication Sig  . ALPRAZolam (XANAX) 0.5 MG tablet Take 1 tablet (0.5 mg total) by mouth daily as needed.  . fluticasone (FLONASE SENSIMIST) 27.5 MCG/SPRAY nasal spray Place 2 sprays into the nose daily.  . sertraline (ZOLOFT) 100 MG tablet Take 1 tablet (100 mg total) by mouth daily.  Marland Kitchen tretinoin (RETIN-A) 0.05 % cream Apply topically at bedtime.  . [DISCONTINUED] doxycycline (VIBRA-TABS) 100 MG tablet Take 1 tablet (100 mg total) by mouth 2 (two) times daily. (Patient not taking: No sig reported)   No facility-administered medications prior to visit.    Review of Systems  Constitutional: Positive for appetite change. Negative for activity change, chills, diaphoresis, fatigue, fever and unexpected weight change.  HENT: Positive for congestion. Negative for ear discharge, ear pain, nosebleeds, postnasal drip, rhinorrhea, sinus pressure and sinus pain.   Respiratory: Positive for cough and chest tightness.   Cardiovascular: Positive for chest pain.  Gastrointestinal: Positive for diarrhea.  Musculoskeletal: Negative.       Objective    There were no vitals taken for this visit.   Physical Exam   Patient is alert and oriented and responsive to questions Engages in conversation with provider. Speaks in full sentences without any pauses without any shortness of breath or distress.    Assessment & Plan     COVID - Plan: DG Chest 2 View,  CBC with Differential/Platelet  Cough - Plan: DG Chest 2 View, CBC with Differential/Platelet, Comprehensive Metabolic Panel (CMET)  Bronchitis due to COVID-19 virus - Plan: DG Chest 2 View, CBC with Differential/Platelet, Comprehensive Metabolic Panel (CMET)  Meds ordered this encounter  Medications  . predniSONE (STERAPRED UNI-PAK 21 TAB) 10 MG (21) TBPK tablet    Sig: PO: Take  6 tablets on day 1:Take 5 tablets day 2:Take 4 tablets day 3: Take 3 tablets day 4:Take 2 tablets day five: 5 Take 1 tablet day 6    Dispense:  21 tablet    Refill:  0  . albuterol (VENTOLIN HFA) 108 (90 Base) MCG/ACT inhaler    Sig: Inhale 1-2 puffs into the lungs every 6 (six) hours as needed for wheezing or shortness of breath.    Dispense:  8 g    Refill:  0  Offered cough medication if needed , she prefers to take hers over the counter medications.   She will go for chest x ray and CBC/ [facility] to rule out pneumonia or leukocytosis or need for antibiotics.   Red Flags discussed. The patient was given clear instructions to go to ER or return to medical center if any red flags develop, symptoms do not improve, worsen or new problems develop. They verbalized understanding.   Return in about 4 days (around 12/06/2020), or if symptoms worsen or fail to improve, for at any time for any worsening symptoms, Go to Emergency room/ urgent care if worse.     I discussed the assessment and treatment plan with the patient. The patient was provided an opportunity to ask questions and all were answered. The patient agreed with the plan and demonstrated an understanding of the instructions.   The patient was advised to call back or seek an in-person evaluation if the symptoms worsen or if the condition fails to improve as anticipated.  I provided 25 minutes of non-face-to-face time during this encounter. The entirety of the information documented in the History of Present Illness, Review of Systems and Physical Exam were personally obtained by me. Portions of this information were initially documented by the CMA and reviewed by me for thoroughness and accuracy.    Marcille Buffy, [place] [phone] (phone) [phone] (fax)  [place]

## 2020-12-03 ENCOUNTER — Other Ambulatory Visit: Payer: Self-pay

## 2020-12-03 ENCOUNTER — Ambulatory Visit
Admission: RE | Admit: 2020-12-03 | Discharge: 2020-12-03 | Disposition: A | Discharge status: Home / Self Care | Payer: PRIVATE HEALTH INSURANCE | Source: Ambulatory Visit | Attending: Adult Health | Admitting: Adult Health

## 2020-12-03 DIAGNOSIS — U071 COVID-19: Secondary | ICD-10-CM | POA: Insufficient documentation

## 2020-12-03 DIAGNOSIS — J40 Bronchitis, not specified as acute or chronic: Secondary | ICD-10-CM | POA: Diagnosis present

## 2020-12-03 DIAGNOSIS — R059 Cough, unspecified: Secondary | ICD-10-CM | POA: Insufficient documentation

## 2020-12-03 NOTE — Progress Notes (Signed)
Chest x ray within normal limits.  No lab work resulted as of yet 12/03/20 1130am.

## 2021-03-12 ENCOUNTER — Other Ambulatory Visit: Payer: Self-pay | Admitting: Family Medicine

## 2021-03-12 DIAGNOSIS — F32A Depression, unspecified: Secondary | ICD-10-CM

## 2021-03-12 DIAGNOSIS — F419 Anxiety disorder, unspecified: Secondary | ICD-10-CM

## 2021-03-12 NOTE — Telephone Encounter (Signed)
Requested medication (s) are due for refill today: yes  Requested medication (s) are on the active medication list: yes  Last refill:  09/14/20   Future visit scheduled: no  Notes to clinic:  No PHQ-2 or PHQ-9 in last 360 days   Requested Prescriptions  Pending Prescriptions Disp Refills   sertraline (ZOLOFT) 100 MG tablet [Pharmacy Med Name: SERTRALINE HCL 100 MG TABLET] 90 tablet 1    Sig: TAKE 1 TABLET BY MOUTH EVERY DAY      Psychiatry:  Antidepressants - SSRI Failed - 03/12/2021 12:34 PM      Failed - Completed PHQ-2 or PHQ-9 in the last 360 days      Passed - Valid encounter within last 6 months    Recent Outpatient Visits           3 months ago [place], FNP   3 months ago Suspected COVID-19 virus infection   [facility], [place], [place]   1 year ago Shortness of breath   [place], [place], [place]   2 years ago Acute non-recurrent pansinusitis   [facility] [place], [place]   2 years ago [place] Jerrol Banana., MD

## 2021-06-13 ENCOUNTER — Other Ambulatory Visit: Payer: Self-pay | Admitting: Family Medicine

## 2021-06-13 DIAGNOSIS — F32A Depression, unspecified: Secondary | ICD-10-CM

## 2021-06-13 DIAGNOSIS — F419 Anxiety disorder, unspecified: Secondary | ICD-10-CM

## 2021-06-13 NOTE — Telephone Encounter (Signed)
Requested medications are due for refill today.  yes  Requested medications are on the active medications list.  yes  Last refill. 03/15/2021  Future visit scheduled.   no  Notes to clinic.  Patient has not been seen for anxiety/depression since 07/04/2018

## 2021-07-22 IMAGING — CR DG CHEST 2V
1 series · 2 of 2 positions shown · non-contrast
Comparison: None.

CLINICAL DATA: Recent history of COVID

Worsening cough
Chest tightness
EXAM:
CHEST - 2 VIEW

[Series 1: dg chest 2 view · 0.14mm/px · 2 of 2 slices shown]
[im 1/2]
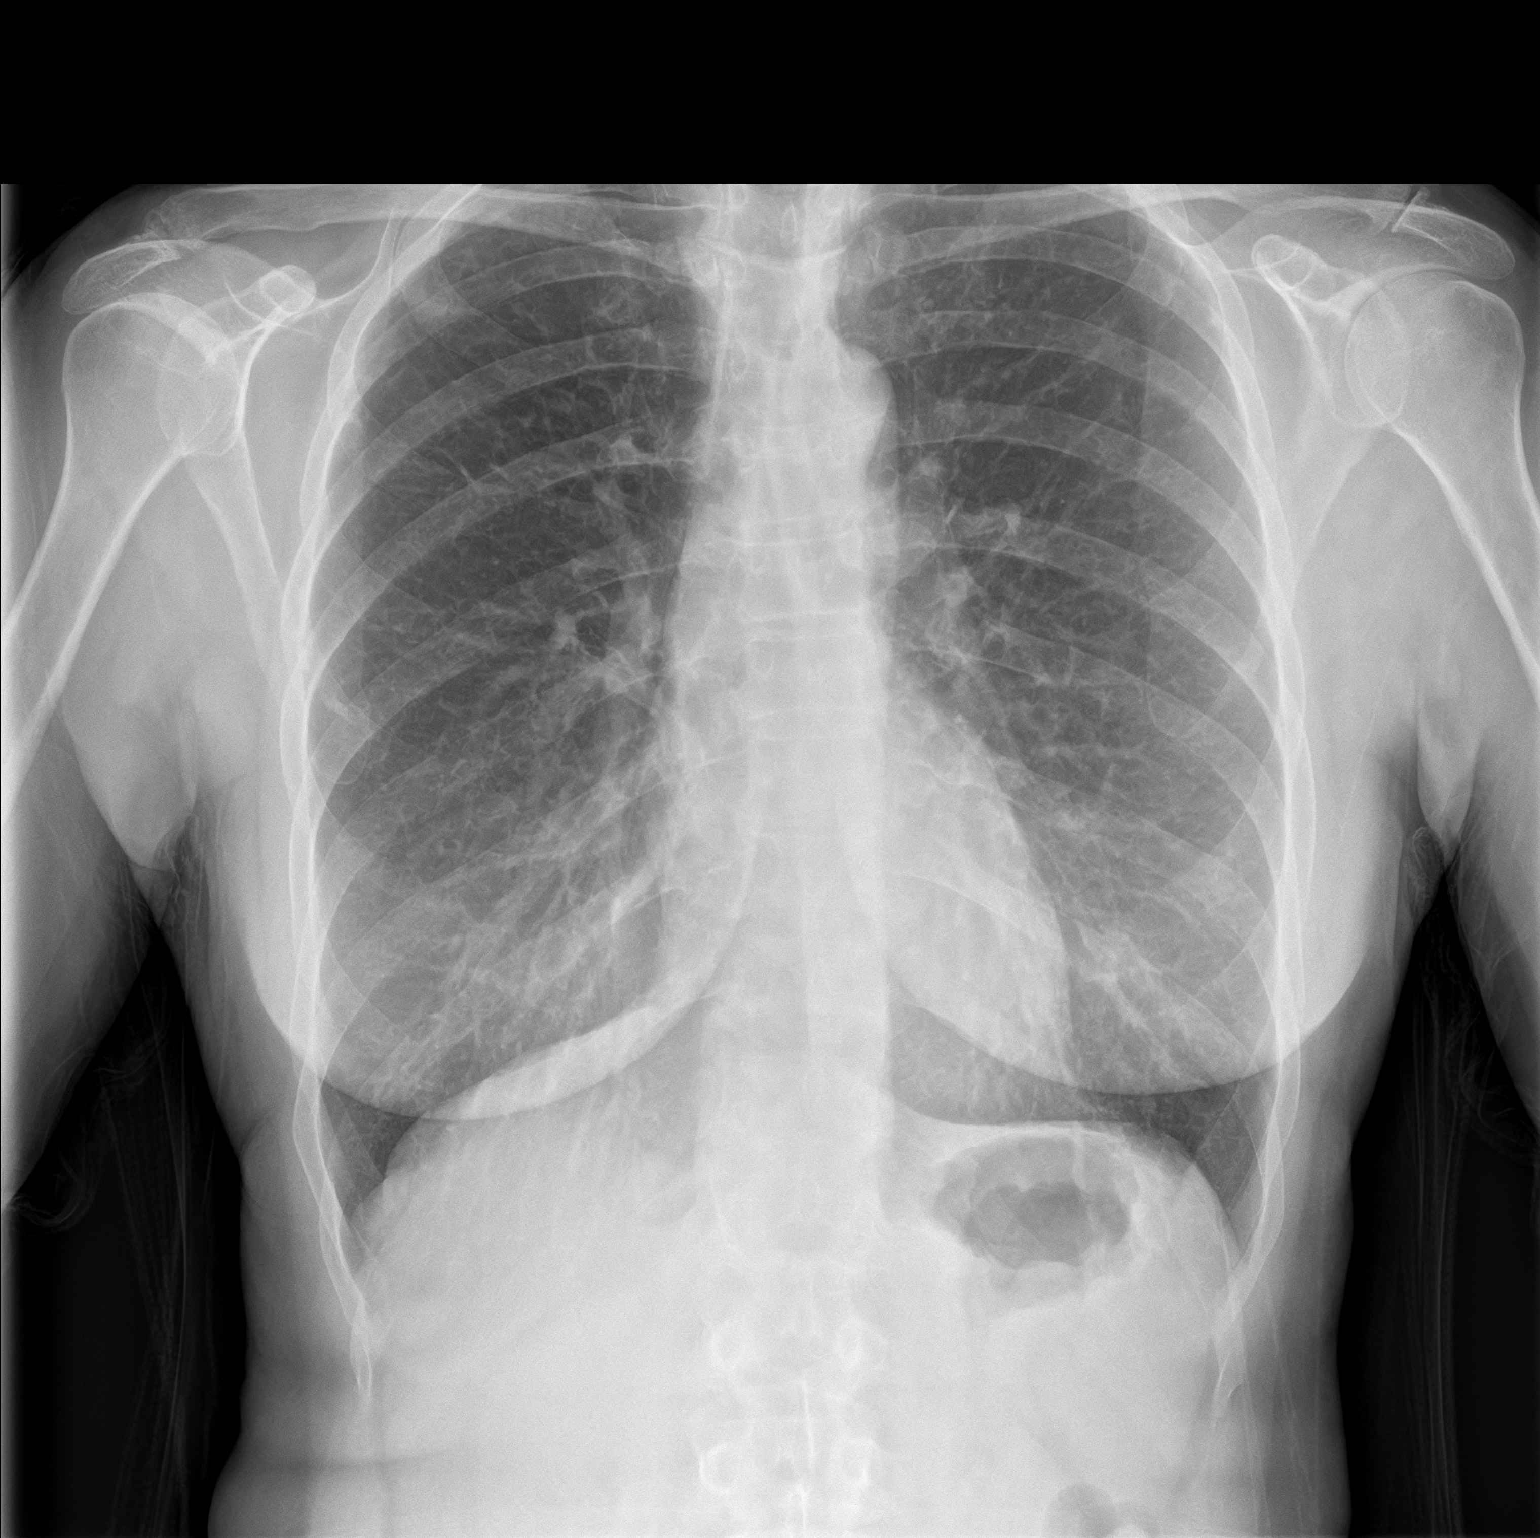
[im 2/2]
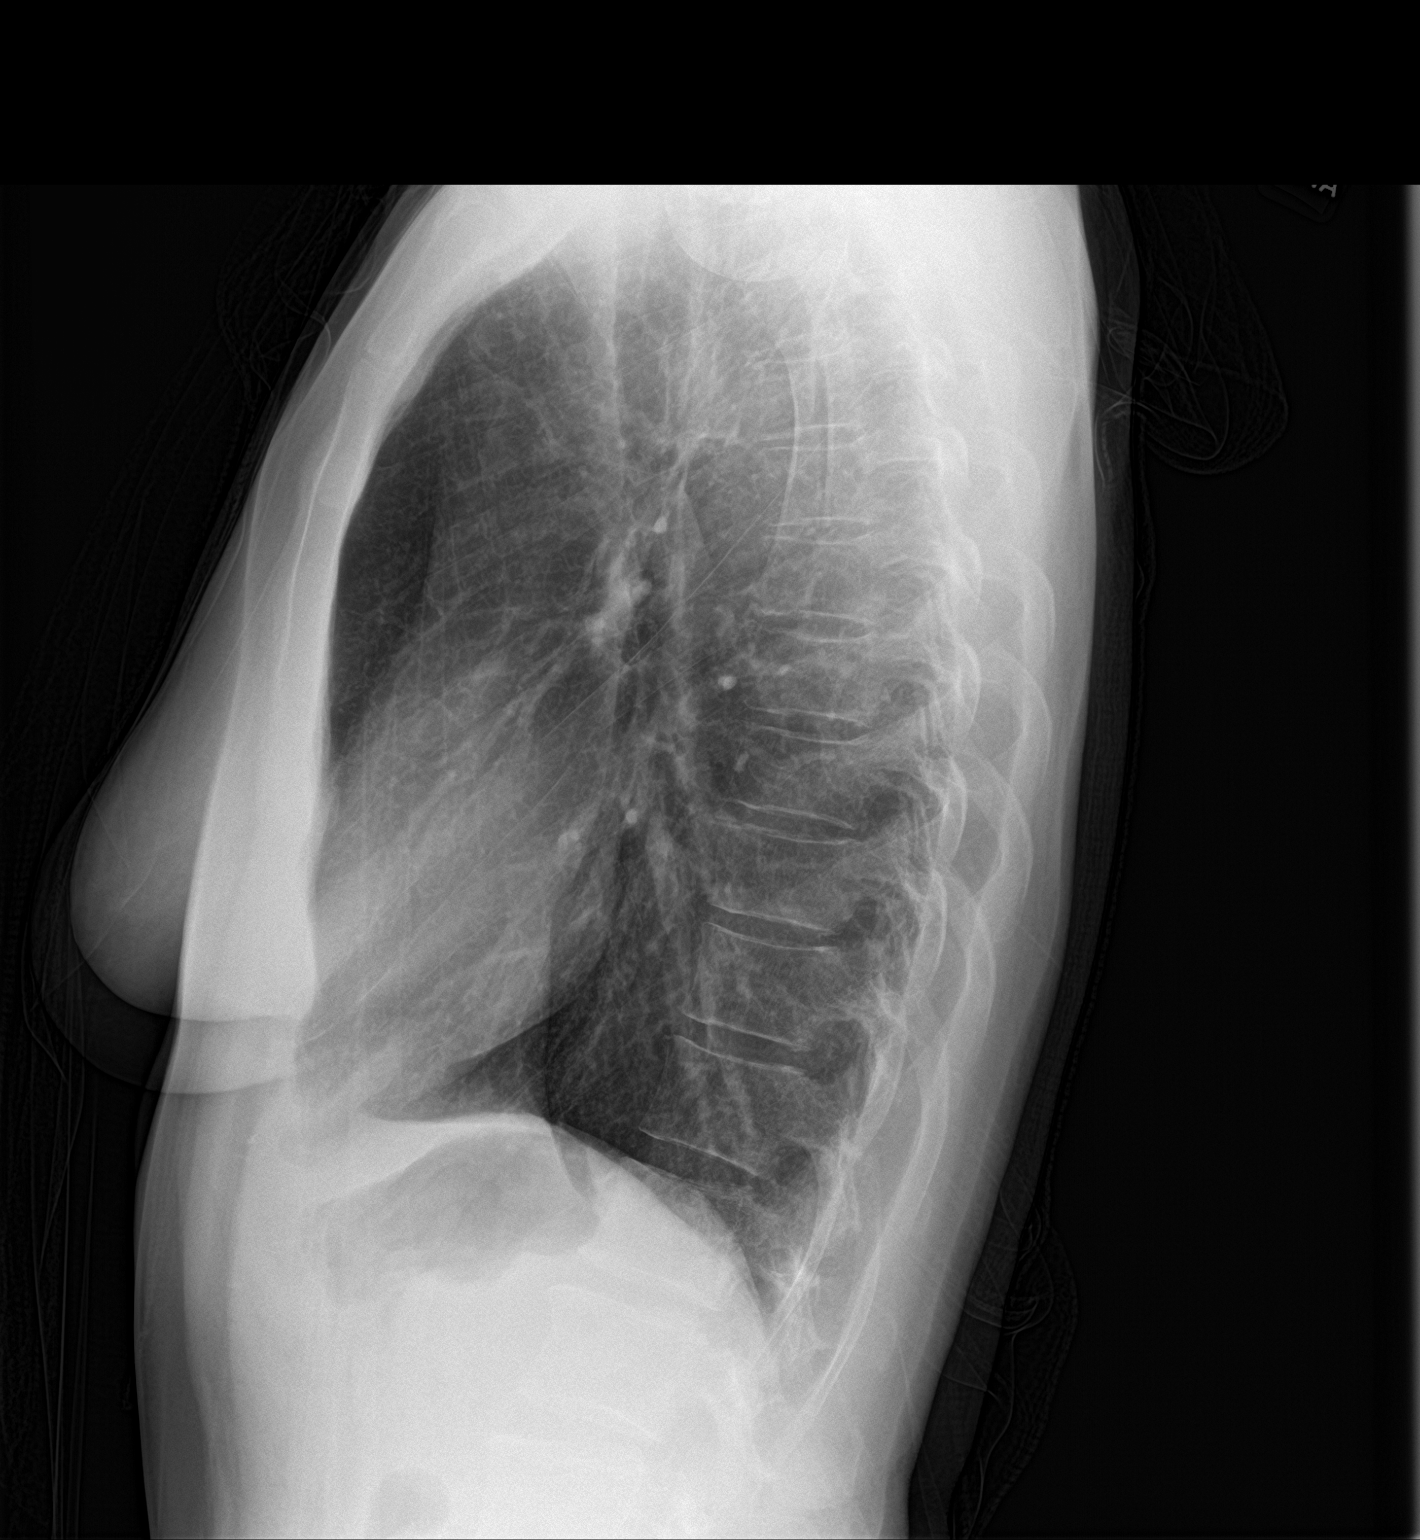

[2 of 2 positions shown; findings below may reference images not displayed]

FINDINGS: The heart size and mediastinal contours are within normal limits.
Both lungs are clear. The visualized skeletal structures are
unremarkable.
IMPRESSION: No acute cardiopulmonary process.

## 2021-11-02 ENCOUNTER — Ambulatory Visit (INDEPENDENT_AMBULATORY_CARE_PROVIDER_SITE_OTHER): Payer: PRIVATE HEALTH INSURANCE | Admitting: Dermatology

## 2021-11-02 ENCOUNTER — Other Ambulatory Visit: Payer: Self-pay

## 2021-11-02 DIAGNOSIS — L821 Other seborrheic keratosis: Secondary | ICD-10-CM | POA: Diagnosis not present

## 2021-11-02 DIAGNOSIS — L578 Other skin changes due to chronic exposure to nonionizing radiation: Secondary | ICD-10-CM

## 2021-11-02 DIAGNOSIS — D1800 Hemangioma unspecified site: Secondary | ICD-10-CM

## 2021-11-02 DIAGNOSIS — L988 Other specified disorders of the skin and subcutaneous tissue: Secondary | ICD-10-CM

## 2021-11-02 DIAGNOSIS — L814 Other melanin hyperpigmentation: Secondary | ICD-10-CM

## 2021-11-02 MED ORDER — TRETINOIN 0.05 % EX CREA: TOPICAL_CREAM | CUTANEOUS | 5 refills | Status: AC

## 2021-11-02 NOTE — Patient Instructions (Addendum)
Topical retinoid medications like tretinoin/Retin-A, adapalene/Differin, tazarotene/Fabior, and Epiduo/Epiduo Forte can cause dryness and irritation when first started. Only apply a pea-sized amount to the entire affected area. Avoid applying it around the eyes, edges of mouth and creases at the nose. If you experience irritation, use a good moisturizer first and/or apply the medicine less often. If you are doing well with the medicine, you can increase how often you use it until you are applying every night. Be careful with sun protection while using this medication as it can make you sensitive to the sun. This medicine should not be used by pregnant women.    Recommend daily broad spectrum sunscreen SPF 30+ to sun-exposed areas, reapply every 2 hours as needed. Call for new or changing lesions.  Staying in the shade or wearing long sleeves, sun glasses (UVA+UVB protection) and wide brim hats (4-inch brim around the entire circumference of the hat) are also recommended for sun protection.   Seborrheic Keratosis  What causes seborrheic keratoses? Seborrheic keratoses are harmless, common skin growths that first appear during adult life.  As time goes by, more growths appear.  Some people may develop a large number of them.  Seborrheic keratoses appear on both covered and uncovered body parts.  They are not caused by sunlight.  The tendency to develop seborrheic keratoses can be inherited.  They vary in color from skin-colored to gray, brown, or even black.  They can be either smooth or have a rough, warty surface.   Seborrheic keratoses are superficial and look as if they were stuck on the skin.  Under the microscope this type of keratosis looks like layers upon layers of skin.  That is why at times the top layer may seem to fall off, but the rest of the growth remains and re-grows.    Treatment Seborrheic keratoses do not need to be treated, but can easily be removed in the office.  Seborrheic  keratoses often cause symptoms when they rub on clothing or jewelry.  Lesions can be in the way of shaving.  If they become inflamed, they can cause itching, soreness, or burning.  Removal of a seborrheic keratosis can be accomplished by freezing, burning, or surgery. If any spot bleeds, scabs, or grows rapidly, please return to have it checked, as these can be an indication of a skin cancer.   If You Need Anything After Your Visit  If you have any questions or concerns for your doctor, please call our main line at [phone] and press option 4 to reach your doctor's medical assistant. If no one answers, please leave a voicemail as directed and we will return your call as soon as possible. Messages left after 4 pm will be answered the following business day.   You may also send [place] a message via [place]. We typically respond to MyChart messages within 1-2 business days.  For prescription refills, please ask your pharmacy to contact our office. Our fax number is [phone].  If you have an urgent issue when the clinic is closed that cannot wait until the next business day, you can page your doctor at the number below.    Please note that while we do our best to be available for urgent issues outside of office hours, we are not available 24/7.   If you have an urgent issue and are unable to reach [place], you may choose to seek medical care at your doctor's office, retail clinic, urgent care center, or emergency room.  If  you have a medical emergency, please immediately call 911 or go to the emergency department.  Pager Numbers  - Dr. Nehemiah Massed: [phone]  - Dr. Laurence Ferrari: [phone]  - Dr. Nicole Kindred: [phone]  In the event of inclement weather, please call our main line at [phone] for an update on the status of any delays or closures.  Dermatology Medication Tips: Please keep the boxes that topical medications come in in order to help keep track of the instructions about where and  how to use these. Pharmacies typically print the medication instructions only on the boxes and not directly on the medication tubes.   If your medication is too expensive, please contact our office at [phone] option 4 or send [place] a message through [place].   We are unable to tell what your co-pay for medications will be in advance as this is different depending on your insurance coverage. However, we may be able to find a substitute medication at lower cost or fill out paperwork to get insurance to cover a needed medication.   If a prior authorization is required to get your medication covered by your insurance company, please allow [place] 1-2 business days to complete this process.  Drug prices often vary depending on where the prescription is filled and some pharmacies may offer cheaper prices.  The website www.goodrx.com contains coupons for medications through different pharmacies. The prices here do not account for what the cost may be with help from insurance (it may be cheaper with your insurance), but the website can give you the price if you did not use any insurance.  - You can print the associated coupon and take it with your prescription to the pharmacy.  - You may also stop by our office during regular business hours and pick up a GoodRx coupon card.  - If you need your prescription sent electronically to a different pharmacy, notify our office through [facility] or by phone at [phone] option 4.     Si Usted Necesita Algo Despus de Su Visita  Tambin puede enviarnos un mensaje a travs de Pharmacist, community. Por lo general respondemos a los mensajes de MyChart en el transcurso de 1 a 2 das hbiles.  Para renovar recetas, por favor pida a su farmacia que se ponga en contacto con nuestra oficina. Harland Dingwall de fax es [place] [phone].  Si tiene un asunto urgente cuando la clnica est cerrada y que no puede esperar hasta el siguiente da hbil, puede llamar/localizar a su  doctor(a) al nmero que aparece a continuacin.   Por favor, tenga en cuenta que aunque hacemos todo lo posible para estar disponibles para asuntos urgentes fuera del horario de [place], no estamos disponibles las 24 horas del da, los 7 das de la [place].   Si tiene un problema urgente y no puede comunicarse con nosotros, puede optar por buscar atencin mdica  en el consultorio de su doctor(a), en una clnica privada, en un centro de atencin urgente o en una sala de emergencias.  Si tiene Engineering geologist, por favor llame inmediatamente al 911 o vaya a la sala de emergencias.  Nmeros de bper  - Dr. Nehemiah Massed: [phone]  - Dra. Moye: [phone]  - Dra. Nicole Kindred: [phone]  En caso de inclemencias del [place], por favor llame a Johnsie Kindred principal al [phone] para una actualizacin sobre el [place] de cualquier retraso o cierre.  Consejos para la medicacin en dermatologa: Por favor, guarde las cajas en las que vienen los medicamentos de [place]  tpico para ayudarle a seguir las instrucciones sobre dnde y cmo usarlos. Las farmacias generalmente imprimen las instrucciones del medicamento slo en las cajas y no directamente en los tubos del [place].   Si su medicamento es muy caro, por favor, pngase en contacto con Zigmund Daniel llamando al [phone] y presione la opcin 4 o envenos un mensaje a travs de Pharmacist, community.   No podemos decirle cul ser su copago por los medicamentos por adelantado ya que esto es diferente dependiendo de la cobertura de su seguro. Sin embargo, es posible que podamos encontrar un medicamento sustituto a Electrical engineer un formulario para que el seguro cubra el medicamento que se considera necesario.   Si se requiere una autorizacin previa para que su compaa de seguros [place] su medicamento, por favor permtanos de 1 a 2 das hbiles para completar este proceso.  Los precios de los medicamentos varan con frecuencia dependiendo del  Environmental consultant de dnde se surte la receta y alguna farmacias pueden ofrecer precios ms baratos.  El sitio web www.goodrx.com tiene cupones para medicamentos de Airline pilot. Los precios aqu no tienen en cuenta lo que podra costar con la ayuda del seguro (puede ser ms barato con su seguro), pero el sitio web puede darle el precio si no utiliz Research scientist (physical sciences).  - Puede imprimir el cupn correspondiente y llevarlo con su receta a la farmacia.  - Tambin puede pasar por nuestra oficina durante el horario de atencin regular y Charity fundraiser una tarjeta de cupones de GoodRx.  - Si necesita que su receta se enve electrnicamente a una farmacia diferente, informe a nuestra oficina a travs de MyChart de [place] o por telfono llamando al [phone] y presione la opcin 4.

## 2021-11-02 NOTE — Progress Notes (Signed)
° °  Follow-Up Visit   Subjective  Elizabeth Walter is a 61 y.o. female who presents for the following: Facial Elastosis (Patient is using tretinoin 0.05% cream 4x/wk. She tolerates ok and would like to possibly increase strength.). She would also like sun-exposed areas checked today. No history of skin cancer.    The following portions of the chart were reviewed this encounter and updated as appropriate:       Review of Systems:  No other skin or systemic complaints except as noted in HPI or Assessment and Plan.  Objective  Well appearing patient in no apparent distress; mood and affect are within normal limits.  A focused examination was performed including face, arms, chest. Relevant physical exam findings are noted in the Assessment and Plan.  face Rhytides and volume loss.     Assessment & Plan  Elastosis of skin face  Discussed using tretinoin 0.05% cream more frequently vs increasing strength and using with moisturizer. Patient will continue with 0.05% strength and use more frequently.  Continue tretinoin 0.05% cream Apply a pea-sized amount to face qhs as tolerated dsp 45g 5Rf. May apply moisturizer first.   Topical retinoid medications like tretinoin/Retin-A, adapalene/Differin, tazarotene/Fabior, and Epiduo/Epiduo Forte can cause dryness and irritation when first started. Only apply a pea-sized amount to the entire affected area. Avoid applying it around the eyes, edges of mouth and creases at the nose. If you experience irritation, use a good moisturizer first and/or apply the medicine less often. If you are doing well with the medicine, you can increase how often you use it until you are applying every night. Be careful with sun protection while using this medication as it can make you sensitive to the sun. This medicine should not be used by pregnant women.    tretinoin (RETIN-A) 0.05 % cream - face Apply a pea-sized amount to face every night as tolerated. May apply  moisturizer first.  Seborrheic Keratoses - Stuck-on, waxy, tan-brown papules and/or plaques  - Benign-appearing - Discussed benign etiology and prognosis. - Observe - Call for any changes - Recommend moisturizer for rough and bumpy skin.   Actinic Damage - chronic, secondary to cumulative UV radiation exposure/sun exposure over time - diffuse scaly erythematous macules with underlying dyspigmentation - Recommend daily broad spectrum sunscreen SPF 30+ to sun-exposed areas, reapply every 2 hours as needed.  - Recommend staying in the shade or wearing long sleeves, sun glasses (UVA+UVB protection) and wide brim hats (4-inch brim around the entire circumference of the hat). - Call for new or changing lesions.  Hemangiomas - Red papules - Discussed benign nature - Observe - Call for any changes  Lentigines - Scattered tan macules - Due to sun exposure - Benign-appering, observe - Recommend daily broad spectrum sunscreen SPF 30+ to sun-exposed areas, reapply every 2 hours as needed. - Call for any changes   Return if symptoms worsen or fail to improve.  IJamesetta Orleans, CMA, am acting as scribe for Brendolyn Patty, MD .  Documentation: I have reviewed the above documentation for accuracy and completeness, and I agree with the above.  Brendolyn Patty MD

## 2022-10-26 ENCOUNTER — Other Ambulatory Visit: Payer: Self-pay | Admitting: Family Medicine

## 2022-10-26 DIAGNOSIS — Z1231 Encounter for screening mammogram for malignant neoplasm of breast: Secondary | ICD-10-CM

## 2022-11-16 ENCOUNTER — Ambulatory Visit
Admission: RE | Admit: 2022-11-16 | Discharge: 2022-11-16 | Disposition: A | Discharge status: Home / Self Care | Payer: PRIVATE HEALTH INSURANCE | Source: Ambulatory Visit | Attending: Family Medicine | Admitting: Family Medicine

## 2022-11-16 DIAGNOSIS — Z1231 Encounter for screening mammogram for malignant neoplasm of breast: Secondary | ICD-10-CM

## 2023-10-26 ENCOUNTER — Other Ambulatory Visit: Payer: Self-pay | Admitting: Family Medicine

## 2023-10-26 DIAGNOSIS — Z1231 Encounter for screening mammogram for malignant neoplasm of breast: Secondary | ICD-10-CM

## 2023-11-19 ENCOUNTER — Ambulatory Visit
Admission: RE | Admit: 2023-11-19 | Discharge: 2023-11-19 | Disposition: A | Discharge status: Home / Self Care | Payer: BC Managed Care – PPO | Source: Ambulatory Visit | Attending: Family Medicine | Admitting: Family Medicine

## 2023-11-19 DIAGNOSIS — Z1231 Encounter for screening mammogram for malignant neoplasm of breast: Secondary | ICD-10-CM

## 2024-07-01 ENCOUNTER — Ambulatory Visit: Payer: BC Managed Care – PPO | Admitting: Dermatology
